# Patient Record
Sex: Female | Born: 1990
Health system: Southern US, Community
[De-identification: ages and names within clinical notes are randomized; demographics above are authoritative.]

## PROBLEM LIST (undated history)

## (undated) DIAGNOSIS — F909 Attention-deficit hyperactivity disorder, unspecified type: Secondary | ICD-10-CM

## (undated) DIAGNOSIS — F39 Unspecified mood [affective] disorder: Secondary | ICD-10-CM

## (undated) DIAGNOSIS — R569 Unspecified convulsions: Secondary | ICD-10-CM

## (undated) HISTORY — DX: Unspecified convulsions: R56.9

## (undated) HISTORY — DX: Unspecified mood (affective) disorder: F39

## (undated) HISTORY — DX: Attention-deficit hyperactivity disorder, unspecified type: F90.9

## (undated) HISTORY — PX: TONSILLECTOMY AND ADENOIDECTOMY: SUR1326

---
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
## 2011-05-02 IMAGING — CT CT PARANASAL SINUSES LIMITED
1 series · 10 of 12 positions shown, 13 images · non-contrast
Comparison: None

CLINICAL DATA: Chronic congestion.

CT PARANASAL SINUS LIMITED WITHOUT CONTRAST
TECHNIQUE: Multidetector CT images of the paranasal sinuses were
obtained in a single plane without contrast.

[Series 3: cor soft · axial · 0.35mm/px · z∈[+37,+127]mm · 10 of 12 slices shown, 13 images]
[im 2/12  brain]
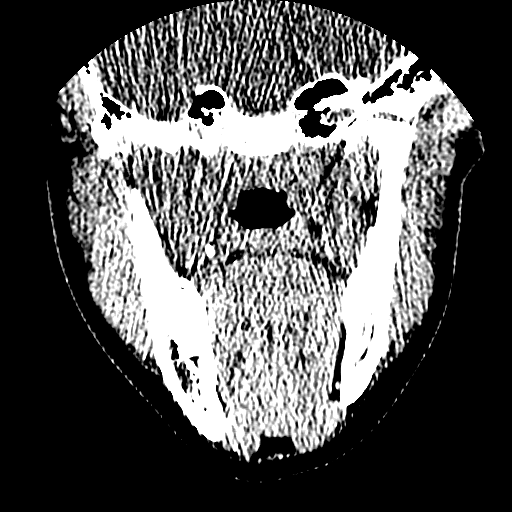
[im 2/12  bone]
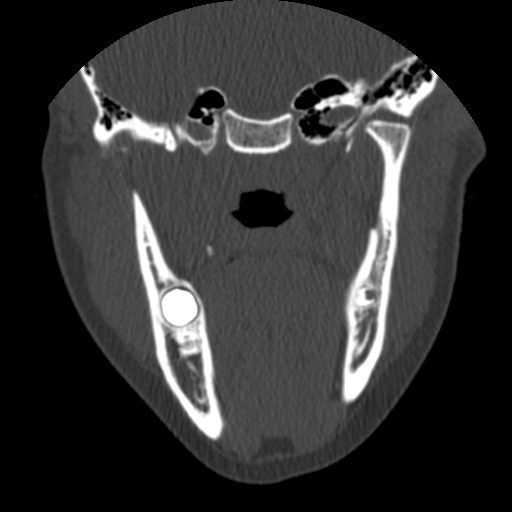
[im 3/12  bone]
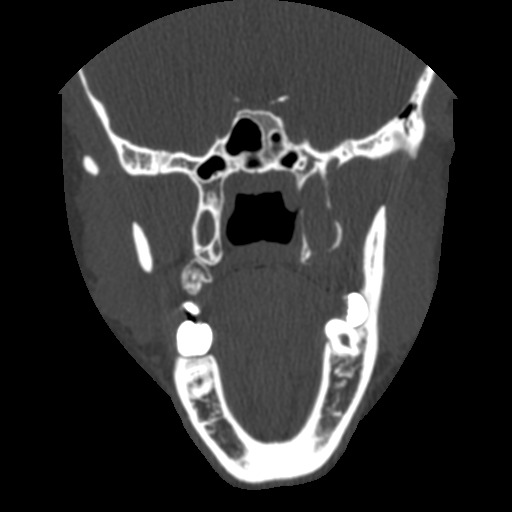
[im 4/12  bone]
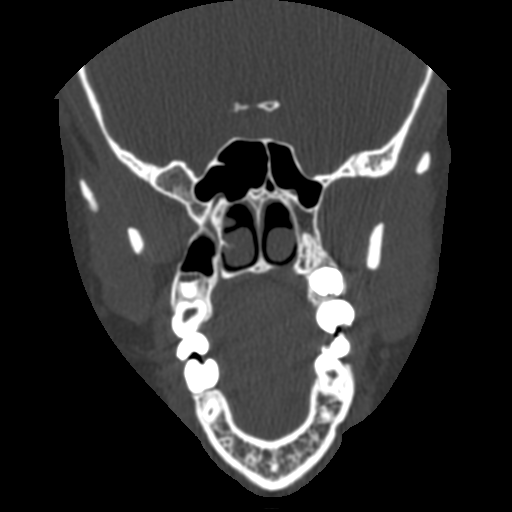
[im 5/12  bone]
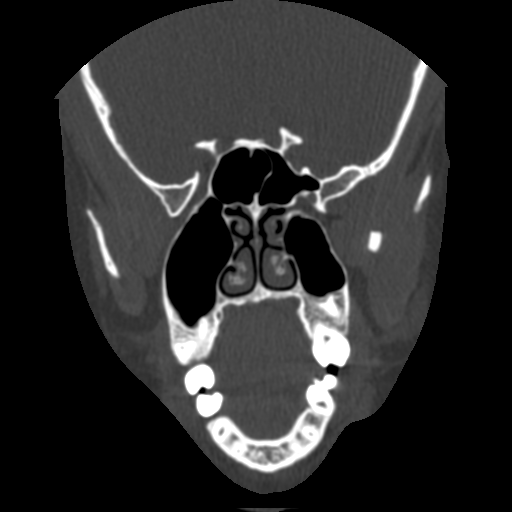
[im 6/12  brain]
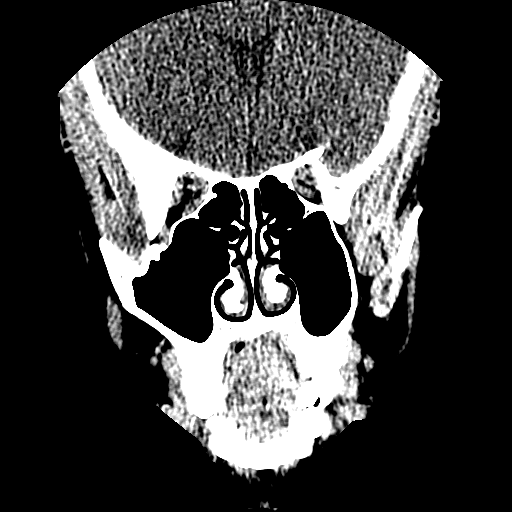
[im 6/12  bone]
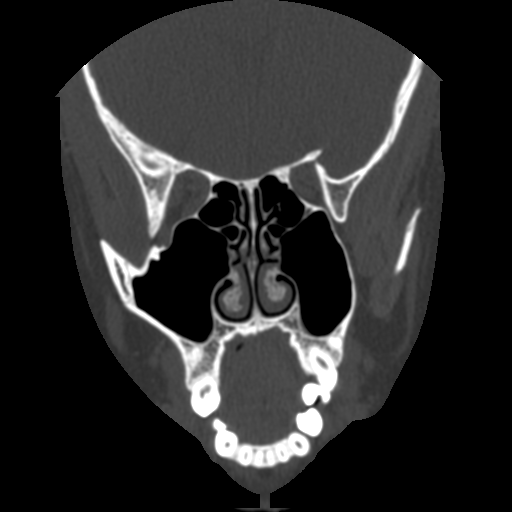
[im 7/12  bone]
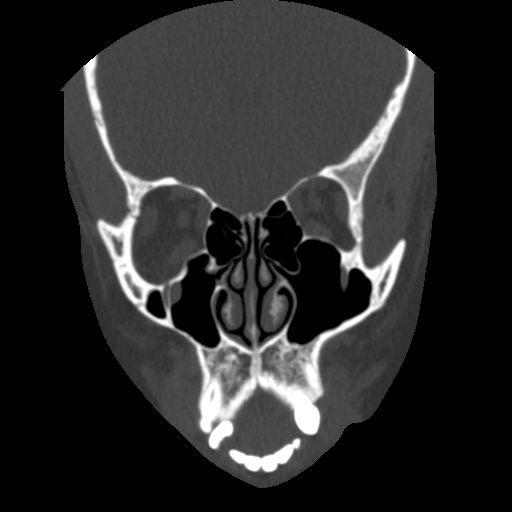
[im 8/12  bone]
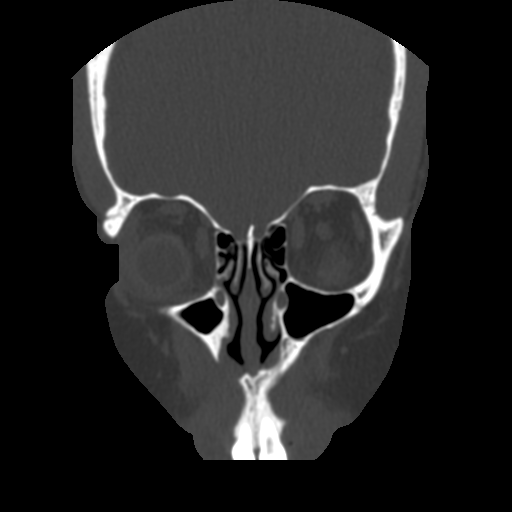
[im 9/12  bone]
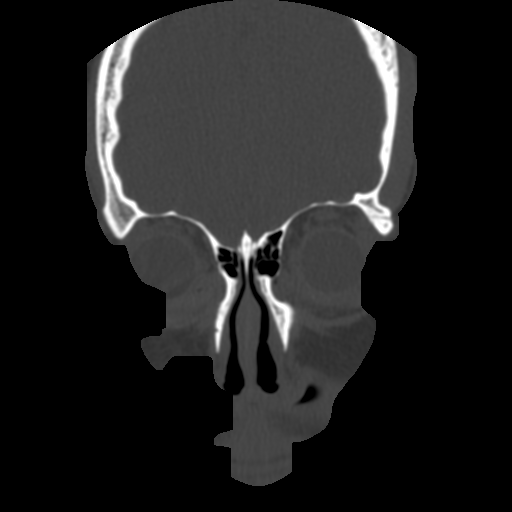
[im 10/12  brain]
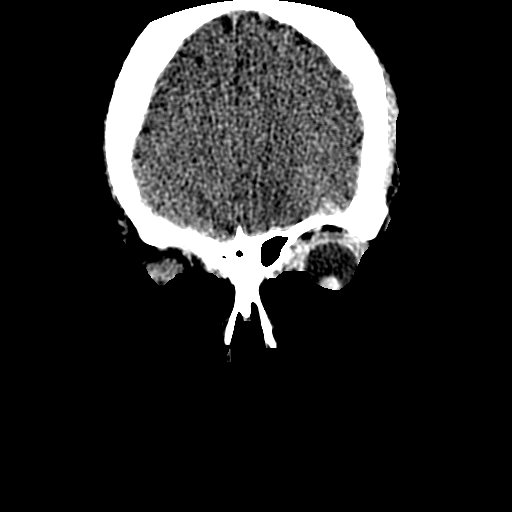
[im 10/12  bone]
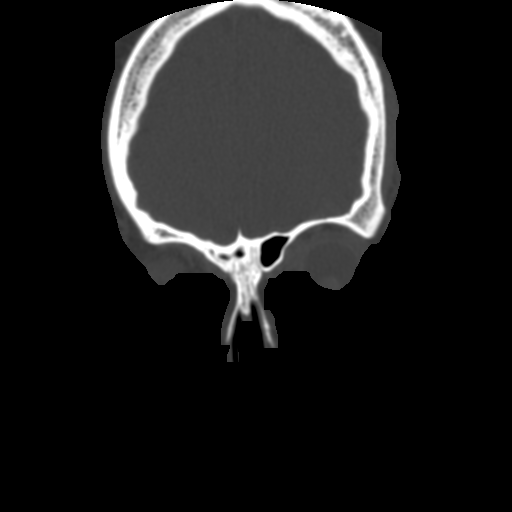
[im 11/12  bone]
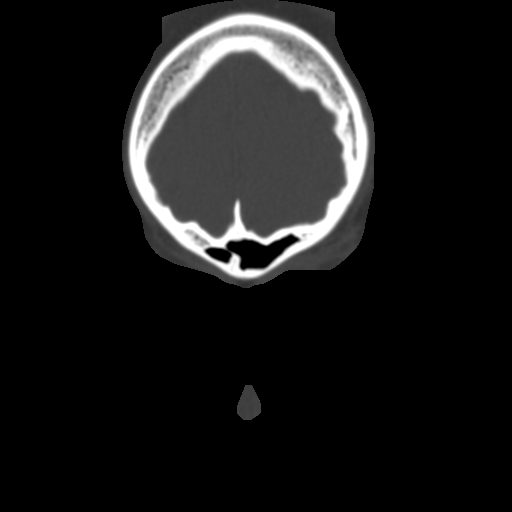

[10 of 12 positions shown; findings below may reference images not displayed]

FINDINGS: Minimal mucoperiosteal thickening noted in focal areas
within the maxillary sinuses bilaterally.  Remainder of the
paranasal sinuses are clear.  No air fluid levels.  No acute bony
abnormality.  Visualized orbital soft tissues unremarkable.
IMPRESSION: Minimal chronic maxillary sinusitis.

## 2011-12-21 ENCOUNTER — Encounter: Payer: Self-pay | Admitting: Family Medicine

## 2011-12-21 ENCOUNTER — Ambulatory Visit (INDEPENDENT_AMBULATORY_CARE_PROVIDER_SITE_OTHER): Payer: Medicaid Other | Admitting: Family Medicine

## 2011-12-21 VITALS — BP 133/89 | HR 78 | Temp 98.8°F | Wt 201.1 lb

## 2011-12-21 DIAGNOSIS — H6090 Unspecified otitis externa, unspecified ear: Secondary | ICD-10-CM | POA: Insufficient documentation

## 2011-12-21 DIAGNOSIS — E669 Obesity, unspecified: Secondary | ICD-10-CM

## 2011-12-21 DIAGNOSIS — K5900 Constipation, unspecified: Secondary | ICD-10-CM | POA: Insufficient documentation

## 2011-12-21 DIAGNOSIS — H60399 Other infective otitis externa, unspecified ear: Secondary | ICD-10-CM

## 2011-12-21 DIAGNOSIS — F39 Unspecified mood [affective] disorder: Secondary | ICD-10-CM

## 2011-12-21 MED ORDER — HYDROCORTISONE-ACETIC ACID 1-2 % OT SOLN: 3.0000 [drp] | Freq: Two times a day (BID) | OTIC | Status: AC

## 2011-12-21 NOTE — Patient Instructions (Signed)
Use ear drops  Stop using Qtips  Can use 1 drop of mineral oil to help keep ears clean  Follow-up yearly

## 2011-12-21 NOTE — Progress Notes (Signed)
  Subjective:    Patient ID: Ashley [name], female    DOB: 1990/08/29, 21 y.o.   MRN: [id number]  HPI Here to establish care  Left ear itching: few days ago.  No pain, no fever.  Using Q-tips to scratch ears  Constipation:  infrequent bowel movements.  Wants to know what to do.  Takes occasional miralax with good resolution.    Review of Systems Patient Information Form: Screening and ROS  AUDIT-C Score: 0 Do you feel safe in relationships? no PHQ-2:negative  Review of Symptoms  General:  Negative for nexplained weight loss, fever Skin: Negative for new or changing mole, sore that won't heal HEENT: Negative for trouble hearing, trouble seeing, ringing in ears, mouth sores, hoarseness, change in voice, dysphagia. CV:  Negative for chest pain, dyspnea, edema, palpitations Resp: Negative for cough, dyspnea, hemoptysis GI: Negative for nausea, vomiting, diarrhea, constipation, abdominal pain, melena, hematochezia. GU: Negative for dysuria, incontinence, urinary hesitance, hematuria, vaginal or penile discharge, polyuria, sexual difficulty, lumps in testicle or breasts MSK: Negative for muscle cramps or aches, joint pain or swelling Neuro: Negative for headaches, weakness, numbness, dizziness, passing out/fainting Psych: Negative for depression, anxiety, memory problems       Objective:   Physical Exam GEN: Alert & Oriented, No acute distress HEENT: [place]/AT. EOMI, PERRLA, no conjunctival injection or scleral icterus.  Bilateral tympanic membranes intact without erythema or effusion.  Left tM with some erythema and exoriation  .  Nares without edema or rhinorrhea.  Oropharynx is without erythema or exudates.  No anterior or posterior cervical lymphadenopathy. CV:  Regular Rate & Rhythm, no murmur Respiratory:  Normal work of breathing, CTAB Abd:  + BS, soft, no tenderness to palpation Ext: no pre-tibial edema Psych:  Alert, oriented.  Illogical thought.  Rapid speech.   Tangential.       Assessment & Plan:

## 2011-12-21 NOTE — Assessment & Plan Note (Signed)
Discussed importance of improving exercise and nutrition to prevent chronic diseases.  Mom and daughter plan on working on exercising together.  They state she has had a negative "diabetes test" in the past year.   Will follow-up screening in 1 year.

## 2011-12-21 NOTE — Assessment & Plan Note (Signed)
Mild irriitation,  rxed vosol hc for itching, advised on proper ear hygiene.

## 2011-12-21 NOTE — Assessment & Plan Note (Signed)
Functional.  Advised to start taking a daily fiber supplement.

## 2012-03-08 ENCOUNTER — Encounter: Payer: Self-pay | Admitting: Family Medicine

## 2012-05-13 ENCOUNTER — Emergency Department (INDEPENDENT_AMBULATORY_CARE_PROVIDER_SITE_OTHER)
Admission: EM | Admit: 2012-05-13 | Discharge: 2012-05-13 | Disposition: A | Discharge status: Home / Self Care | Payer: Medicaid Other | Source: Home / Self Care | Attending: Emergency Medicine | Admitting: Emergency Medicine

## 2012-05-13 ENCOUNTER — Encounter (HOSPITAL_COMMUNITY): Payer: Self-pay | Admitting: Emergency Medicine

## 2012-05-13 DIAGNOSIS — J309 Allergic rhinitis, unspecified: Secondary | ICD-10-CM

## 2012-05-13 DIAGNOSIS — H6090 Unspecified otitis externa, unspecified ear: Secondary | ICD-10-CM

## 2012-05-13 DIAGNOSIS — J329 Chronic sinusitis, unspecified: Secondary | ICD-10-CM

## 2012-05-13 MED ORDER — AMOXICILLIN-POT CLAVULANATE 875-125 MG PO TABS: 1.0000 | ORAL_TABLET | Freq: Two times a day (BID) | ORAL | Status: DC

## 2012-05-13 MED ORDER — METHYLPREDNISOLONE ACETATE 80 MG/ML IJ SUSP
80.0000 mg | Freq: Once | INTRAMUSCULAR | Status: AC
  Administered 2012-05-13: 80 mg via INTRAMUSCULAR

## 2012-05-13 MED ORDER — PREDNISONE 10 MG PO TABS: ORAL_TABLET | ORAL | Status: DC

## 2012-05-13 MED ORDER — METHYLPREDNISOLONE ACETATE 80 MG/ML IJ SUSP
INTRAMUSCULAR | Status: AC
  Filled 2012-05-13: qty 1

## 2012-05-13 MED ORDER — HYDROCORTISONE-ACETIC ACID 1-2 % OT SOLN: 3.0000 [drp] | Freq: Three times a day (TID) | OTIC | Status: DC

## 2012-05-13 NOTE — ED Notes (Signed)
Pt c/o headaches, sneezing, head stuffiness. Itching/irritation of left ear. Pt does have thick sinus drainage. Symptoms x several days.

## 2012-05-13 NOTE — ED Provider Notes (Signed)
Chief Complaint  Patient presents with  . Facial Pain    History of Present Illness:   Ashley [name] is a 21 year old female who comes in today accompanied by her guardian. She and the guardian relate a 5-6 month history of chronic nasal symptoms. She describes sneezing, nasal congestion with clear to yellow drainage, itching of the nose, difficulty breathing his nose, itchy, watery, red eyes, and daily headaches which last all day long for which he takes ibuprofen without much relief. She also describes itching of her left ear but no pain or drainage. She tried some drops and this became better transiently. She has constant sensation of postnasal drip and frequent throat clearing. She seen Dr. Reather Converse for allergies and is on Astapro, steroid nasal spray, and Zyrtec. Dr. Willa Rough is also considering allergy shots. She denies fever, chills, sore throat, cough, or wheezing.  Review of Systems:  Other than noted above, the patient denies any of the following symptoms. Systemic:  No fever, chills, sweats, fatigue, myalgias, headache, or anorexia. Eye:  No redness, pain or drainage. ENT:  No earache, ear congestion, nasal congestion, sneezing, rhinorrhea, sinus pressure, sinus pain, post nasal drip, or sore throat. Lungs:  No cough, sputum production, wheezing, shortness of breath, or chest pain. GI:  No abdominal pain, nausea, vomiting, or diarrhea.  PMFSH:  Past medical history, family history, social history, meds, and allergies were reviewed.  Physical Exam:   Vital signs:  BP 104/90  Pulse 78  Temp 99.4 F (37.4 C) (Oral)  Resp 18  SpO2 100% General:  Alert, in no distress. Eye:  No conjunctival injection or drainage. Lids were normal. ENT:  TMs and canals were normal, without erythema or inflammation.  Nasal mucosa was clear and uncongested, without drainage.  Mucous membranes were moist.  Pharynx was clear, without exudate or drainage.  There were no oral ulcerations or lesions. Neck:  Supple,  no adenopathy, tenderness or mass. Lungs:  No respiratory distress.  Lungs were clear to auscultation, without wheezes, rales or rhonchi.  Breath sounds were clear and equal bilaterally.  Heart:  Regular rhythm, without gallops, murmers or rubs. Skin:  Clear, warm, and dry, without rash or lesions.  Course in Urgent Care Center:   She was given Depo-Medrol 80 mg IM and tolerated this well without any immediate side effects.  Assessment:  The primary encounter diagnosis was Allergic rhinitis. Diagnoses of Chronic sinusitis and Otitis externa were also pertinent to this visit.  Plan:   1.  The following meds were prescribed:   New Prescriptions   ACETIC ACID-HYDROCORTISONE (VOSOL-HC) OTIC SOLUTION    Place 3 drops into the left ear 3 (three) times daily.   AMOXICILLIN-CLAVULANATE (AUGMENTIN) 875-125 MG PER TABLET    Take 1 tablet by mouth 2 (two) times daily.   PREDNISONE (DELTASONE) 10 MG TABLET    Take 4 tabs daily for 4 days, 3 tabs daily for 4 days, 2 tabs daily for 4 days, then 1 tab daily for 4 days.   2.  The patient was instructed in symptomatic care and handouts were given. She was given information in allergen avoidance and told to followup with Dr. Willa Rough. 3.  The patient was told to return if becoming worse in any way, if no better in 3 or 4 days, and given some red flag symptoms that would indicate earlier return.   Reuben Likes, MD 05/13/12 1016

## 2012-10-25 ENCOUNTER — Telehealth: Payer: Self-pay | Admitting: *Deleted

## 2012-10-25 NOTE — Telephone Encounter (Signed)
Mother is calling - states Ashley [name] was inpatient this past weekend and was without her birth control pills. Ashley [name] has started to have a withdrawal bleed and mother wants to know what to do to stop it. Ashley [name] has resumed her Seasonique. Per Dr Clearance Coots: mother may give Ashley [name] an additional OCP daily for 7d then back down to her usual dose. Mother voiced understanding.

## 2013-01-23 ENCOUNTER — Ambulatory Visit (INDEPENDENT_AMBULATORY_CARE_PROVIDER_SITE_OTHER): Payer: Medicare Other | Admitting: Obstetrics & Gynecology

## 2013-01-23 ENCOUNTER — Encounter: Payer: Self-pay | Admitting: Obstetrics & Gynecology

## 2013-01-23 VITALS — BP 105/69 | HR 79 | Temp 98.5°F | Wt 202.4 lb

## 2013-01-23 DIAGNOSIS — N898 Other specified noninflammatory disorders of vagina: Secondary | ICD-10-CM

## 2013-01-23 DIAGNOSIS — N946 Dysmenorrhea, unspecified: Secondary | ICD-10-CM

## 2013-01-23 DIAGNOSIS — Z708 Other sex counseling: Secondary | ICD-10-CM

## 2013-01-23 DIAGNOSIS — IMO0001 Reserved for inherently not codable concepts without codable children: Secondary | ICD-10-CM

## 2013-01-23 LAB — POCT URINE PREGNANCY: Preg Test, Ur: NEGATIVE

## 2013-01-23 NOTE — Progress Notes (Signed)
.   Subjective:     Ashley [name] is a 22 y.o. female here for a follow exam and refill on her birth control.  Current complaints - abnormal discharge.  Personal health questionnaire reviewed: no.   Gynecologic History No LMP recorded. Patient is not currently having periods (Reason: Other). Contraception: OCP (estrogen/progesterone) Last Pap: N/A Last mammogram: N/A  Obstetric History OB History   Grav Para Term Preterm Abortions TAB SAB Ect Mult Living                   The following portions of the patient's history were reviewed and updated as appropriate: allergies, current medications, past family history, past medical history, past social history, past surgical history and problem list.  Review of Systems Pertinent items are noted in HPI.    Objective:    General appearance: alert Breasts: normal appearance, no masses or tenderness Abdomen: soft, non-tender; bowel sounds normal; no masses,  no organomegaly Pelvic: cervix normal in appearance, external genitalia normal, no adnexal masses or tenderness, uterus normal size, shape, and consistency and vagina normal without discharge    Assessment:    Healthy female exam.    Plan:    Return in 6 mths

## 2013-01-24 ENCOUNTER — Encounter: Payer: Self-pay | Admitting: Obstetrics & Gynecology

## 2013-01-25 DIAGNOSIS — N946 Dysmenorrhea, unspecified: Secondary | ICD-10-CM | POA: Insufficient documentation

## 2013-01-25 LAB — GC/CHLAMYDIA PROBE AMP: GC Probe RNA: NEGATIVE

## 2013-01-25 NOTE — Patient Instructions (Signed)
Health Maintenance, 18- to 21-Year-Old SCHOOL PERFORMANCE After high school completion, the young adult may be attending college, technical or vocational school, or entering the military or the work force. SOCIAL AND EMOTIONAL DEVELOPMENT The young adult establishes adult relationships and explores sexual identity. Young adults may be living at home or in a college dorm or apartment. Increasing independence is important with young adults. Throughout adolescence, teens should assume responsibility of their own health care. IMMUNIZATIONS Most young adults should be fully vaccinated. A booster dose of Tdap (tetanus, diphtheria, and pertussis, or "whooping cough"), a dose of meningococcal vaccine to protect against a certain type of bacterial meningitis, hepatitis A, human papillomarvirus (HPV), chickenpox, or measles vaccines may be indicated, if not given at an earlier age. Annual influenza or "flu" vaccination should be considered during flu season.  TESTING Annual screening for vision and hearing problems is recommended. Vision should be screened objectively at least once between 18 and 21 years of age. The young adult may be screened for anemia or tuberculosis. Young adults should have a blood test to check for high cholesterol during this time period. Young adults should be screened for use of alcohol and drugs. If the young adult is sexually active, screening for sexually transmitted infections, pregnancy, or HIV may be performed. Screening for cervical cancer should be performed within 3 years of beginning sexual activity. NUTRITION AND ORAL HEALTH  Adequate calcium intake is important. Consume 3 servings of low-fat milk and dairy products daily. For those who do not drink milk or consume dairy products, calcium enriched foods, such as juice, bread, or cereal, dark, leafy greens, or canned fish are alternate sources of calcium.  Drink plenty of water. Limit fruit juice to 8 to 12 ounces per day.  Avoid sugary beverages or sodas.  Discourage skipping meals, especially breakfast. Teens should eat a good variety of vegetables and fruits, as well as lean meats.  Avoid high fat, high salt, and high sugar foods, such as candy, chips, and cookies.  Encourage young adults to participate in meal planning and preparation.  Eat meals together as a family whenever possible. Encourage conversation at mealtime.  Limit fast food choices and eating out at restaurants.  Brush teeth twice a day and floss.  Schedule dental exams twice a year. SLEEP Regular sleep habits are important. PHYSICAL, SOCIAL, AND EMOTIONAL DEVELOPMENT  One hour of regular physical activity daily is recommended. Continue to participate in sports.  Encourage young adults to develop their own interests and consider community service or volunteerism.  Provide guidance to the young adult in making decisions about college and work plans.  Make sure that young adults know that they should never be in a situation that makes them uncomfortable, and they should tell partners if they do not want to engage in sexual activity.  Talk to the young adult about body image. Eating disorders may be noted at this time. Young adults may also be concerned about being overweight. Monitor the young adult for weight gain or loss.  Mood disturbances, depression, anxiety, alcoholism, or attention problems may be noted in young adults. Talk to the caregiver if there are concerns about mental illness.  Negotiate limit setting and independent decision making.  Encourage the young adult to handle conflict without physical violence.  Avoid loud noises which may impair hearing.  Limit television and computer time to 2 hours per day. Individuals who engage in excessive sedentary activity are more likely to become overweight. RISK BEHAVIORS  Sexually active   young adults need to take precautions against pregnancy and sexually transmitted  infections. Talk to young adults about contraception.  Provide a tobacco-free and drug-free environment for the young adult. Talk to the young adult about drug, tobacco, and alcohol use among friends or at friends' homes. Make sure the young adult knows that smoking tobacco or marijuana and taking drugs have health consequences and may impact brain development.  Teach the young adult about appropriate use of over-the-counter or prescription medicines.  Establish guidelines for driving and for riding with friends.  Talk to young adults about the risks of drinking and driving or boating. Encourage the young adult to call you if he or she or friends have been drinking or using drugs.  Remind young adults to wear seat belts at all times in cars and life vests in boats.  Young adults should always wear a properly fitted helmet when they are riding a bicycle.  Use caution with all-terrain vehicles (ATVs) or other motorized vehicles.  Do not keep handguns in the home. (If you do, the gun and ammunition should be locked separately and out of the young adult's access.)  Equip your home with smoke detectors and change the batteries regularly. Make sure all family members know the fire escape plans for your home.  Teach young adults not to swim alone and not to dive in shallow water.  All individuals should wear sunscreen that protects against UVA and UVB light with at least a sun protection factor (SPF) of 30 when out in the sun. This minimizes sun burning. WHAT'S NEXT? Young adults should visit their pediatrician or family physician yearly. By young adulthood, health care should be transitioned to a family physician or internal medicine specialist. Sexually active females may want to begin annual physical exams with a gynecologist. Document Released: 10/20/2006 Document Revised: 10/17/2011 Document Reviewed: 11/09/2006 [employer] Patient Information 2014 [employer].  

## 2013-08-21 ENCOUNTER — Ambulatory Visit (INDEPENDENT_AMBULATORY_CARE_PROVIDER_SITE_OTHER): Payer: Medicare Other | Admitting: Obstetrics & Gynecology

## 2013-09-02 ENCOUNTER — Encounter: Payer: Self-pay | Admitting: Obstetrics & Gynecology

## 2013-09-02 ENCOUNTER — Ambulatory Visit (INDEPENDENT_AMBULATORY_CARE_PROVIDER_SITE_OTHER): Payer: Medicare Other | Admitting: Obstetrics & Gynecology

## 2013-09-02 VITALS — BP 119/79 | HR 84 | Temp 97.9°F | Wt 228.0 lb

## 2013-09-02 DIAGNOSIS — Z0000 Encounter for general adult medical examination without abnormal findings: Secondary | ICD-10-CM

## 2013-09-02 DIAGNOSIS — N760 Acute vaginitis: Secondary | ICD-10-CM

## 2013-09-02 DIAGNOSIS — Z113 Encounter for screening for infections with a predominantly sexual mode of transmission: Secondary | ICD-10-CM

## 2013-09-02 DIAGNOSIS — Z01419 Encounter for gynecological examination (general) (routine) without abnormal findings: Secondary | ICD-10-CM

## 2013-09-02 DIAGNOSIS — Z124 Encounter for screening for malignant neoplasm of cervix: Secondary | ICD-10-CM

## 2013-09-02 DIAGNOSIS — F070 Personality change due to known physiological condition: Secondary | ICD-10-CM

## 2013-09-02 MED ORDER — CLOTRIMAZOLE 1 % EX CREA: 1.0000 "application " | TOPICAL_CREAM | Freq: Two times a day (BID) | CUTANEOUS | Status: DC

## 2013-09-02 NOTE — Patient Instructions (Signed)
Human Papillomavirus Vaccine, Quadrivalent What is this medicine? HUMAN PAPILLOMAVIRUS VACCINE (HYOO muhn pap uh LOH muh vahy ruhs vak SEEN) is a vaccine. It is used to prevent infections of four types of the human papillomavirus. In women, the vaccine may lower your risk of getting cervical, vaginal, or anal cancer and genital warts. In men, the vaccine may lower your risk of getting genital warts and anal cancer. You cannot get these diseases from the vaccine. This vaccine does not treat these diseases. This medicine may be used for other purposes; ask your health care provider or pharmacist if you have questions. COMMON BRAND NAME(S): Gardasil What should I tell my health care provider before I take this medicine? They need to know if you have any of these conditions: -fever or infection -hemophilia -HIV infection or AIDS -immune system problems -low platelet count -an unusual reaction to Human Papillomavirus Vaccine, yeast, other medicines, foods, dyes, or preservatives -pregnant or trying to get pregnant -breast-feeding How should I use this medicine? This vaccine is for injection in a muscle on your upper arm or thigh. It is given by a health care professional. Dennis Bast will be observed for 15 minutes after each dose. Sometimes, fainting happens after the vaccine is given. You may be asked to sit or lie down during the 15 minutes. Three doses are given. The second dose is given 2 months after the first dose. The last dose is given 4 months after the second dose. A copy of a Vaccine Information Statement will be given before each vaccination. Read this sheet carefully each time. The sheet may change frequently. Talk to your pediatrician regarding the use of this medicine in children. While this drug may be prescribed for children as young as 23 years of age for selected conditions, precautions do apply. Overdosage: If you think you have taken too much of this medicine contact a poison control  center or emergency room at once. NOTE: This medicine is only for you. Do not share this medicine with others. What if I miss a dose? All 3 doses of the vaccine should be given within 6 months. Remember to keep appointments for follow-up doses. Your health care provider will tell you when to return for the next vaccine. Ask your health care professional for advice if you are unable to keep an appointment or miss a scheduled dose. What may interact with this medicine? -medicines that suppress your immune system like some medicines for cancer -steroid medicines like prednisone or cortisone -other vaccines This list may not describe all possible interactions. Give your health care provider a list of all the medicines, herbs, non-prescription drugs, or dietary supplements you use. Also tell them if you smoke, drink alcohol, or use illegal drugs. Some items may interact with your medicine. What should I watch for while using this medicine? This vaccine may not fully protect everyone. Continue to have regular pelvic exams and cervical or anal cancer screenings as directed by your doctor. The Human Papillomavirus is a sexually transmitted disease. It can be passed by any kind of sexual activity that involves genital contact. The vaccine works best when given before you have any contact with the virus. Many people who have the virus do not have any signs or symptoms. Tell your doctor or health care professional if you have any reaction or unusual symptom after getting the vaccine. What side effects may I notice from receiving this medicine? Side effects that you should report to your doctor or health care professional  as soon as possible: -allergic reactions like skin rash, itching or hives, swelling of the face, lips, or tongue -breathing problems -feeling faint or lightheaded, falls Side effects that usually do not require medical attention (report to your doctor or health care professional if they  continue or are bothersome): -cough -fever -redness, warmth, swelling, pain, or itching at site where injected This list may not describe all possible side effects. Call your doctor for medical advice about side effects. You may report side effects to FDA at 1-800-FDA-1088. Where should I keep my medicine? This drug is given in a hospital or clinic and will not be stored at home. NOTE: This sheet is a summary. It may not cover all possible information. If you have questions about this medicine, talk to your doctor, pharmacist, or health care provider.  2014, [employer]vier/Gold Standard. (2009-07-30 11:52:23)

## 2013-09-02 NOTE — Progress Notes (Signed)
Subjective:     Ashley [name] is a 23 y.o. female here for a routine exam.  Current complaints:  Pt states that she does have occasional itching.  Personal health questionnaire reviewed: yes.   Gynecologic History Patient's last menstrual period was 08/05/2013. Contraception: OCP (estrogen/progesterone) Last Pap: none   Obstetric History OB History  No data available     The following portions of the patient's history were reviewed and updated as appropriate: allergies, current medications, past family history, past medical history, past social history, past surgical history and problem list.  Review of Systems Pertinent items are noted in HPI.    Objective:    BP 119/79  Pulse 84  Temp(Src) 97.9 F (36.6 C)  Wt 228 lb (103.42 kg)  LMP 08/05/2013  General Appearance:    Alert, cooperative, no distress, appears stated age  Breast Exam:    No tenderness, masses, or nipple abnormality  Abdomen:     Soft, non-tender, bowel sounds active all four quadrants,    no masses, no organomegaly  Genitalia:    Focal skin with white appearance SPEC: scant white discharge Bimanual: uterus NT, no adnexal masses, NT   Assessment:   Cutaneous candida  Plan:  HIV RPR Topical antifungal Return prn

## 2013-09-03 LAB — PAP IG AND CT-NG NAA
Chlamydia Probe Amp: NEGATIVE
GC Probe Amp: NEGATIVE

## 2013-09-03 LAB — RPR

## 2013-09-03 LAB — HIV ANTIBODY (ROUTINE TESTING W REFLEX): HIV: NONREACTIVE

## 2013-12-31 ENCOUNTER — Telehealth: Payer: Self-pay | Admitting: *Deleted

## 2013-12-31 NOTE — Telephone Encounter (Signed)
Mother called and requested samples of birth control to have on hand if Ashley [name] needs them to suppliment her OCP. Ashley [name] dropped her pill and the samples she has on hand have expired. Told mother she could come to the office and pick up some more.

## 2014-01-22 ENCOUNTER — Ambulatory Visit: Payer: Medicare Other | Admitting: Obstetrics & Gynecology

## 2014-02-03 ENCOUNTER — Encounter: Payer: Self-pay | Admitting: Obstetrics & Gynecology

## 2014-02-03 ENCOUNTER — Ambulatory Visit (INDEPENDENT_AMBULATORY_CARE_PROVIDER_SITE_OTHER): Payer: Medicaid Other | Admitting: Obstetrics & Gynecology

## 2014-02-03 VITALS — BP 125/85 | HR 75 | Temp 98.9°F | Wt 228.0 lb

## 2014-02-03 DIAGNOSIS — N946 Dysmenorrhea, unspecified: Secondary | ICD-10-CM

## 2014-02-03 NOTE — Patient Instructions (Signed)
Human Papillomavirus (HPV) Gardasil® Vaccine  What You Need to Know  WHAT IS HPV?  · Genital human papillomavirus (HPV) is the most common sexually transmitted virus in the [place]. More than half of sexually active men and women are infected with HPV at some time in their lives.  · About 20 million Americans are currently infected, and about 6 million more get infected each year. HPV is usually spread through sexual contact.  · Most HPV infections do not cause any symptoms and go away on their own. But HPV can cause cervical cancer in women. Cervical cancer is the 2nd leading cause of cancer deaths among women around the world. In the [place], about 12,000 women get cervical cancer every year and about 4,000 are expected to die from it.  · HPV is also associated with several less common cancers, such as vaginal and vulvar cancers in women, and anal and oropharyngeal (back of the throat, including base of tongue and tonsils) cancers in both men and women. HPV can also cause genital warts and warts in the throat.  · There is no cure for HPV infection, but some of the problems it causes can be treated.  HPV VACCINE: WHY GET VACCINATED?  · The HPV vaccine you are getting is 1 of 2 vaccines that can be given to prevent HPV. It may be given to both males and females.  · This vaccine can prevent most cases of cervical cancer in females, if it is given before exposure to the virus. In addition, it can prevent vaginal and vulvar cancer in females, and genital warts and anal cancer in both males and females.  · Protection from HPV vaccine is expected to be long-lasting. But vaccination is not a substitute for cervical cancer screening. Women should still get regular Pap tests.  WHO SHOULD GET THIS HPV VACCINE AND WHEN?  HPV vaccine is given as a 3-dose series.  · 1st Dose: Now.  · 2nd Dose: 1 to 2 months after Dose 1.  · 3rd Dose: 6 months after Dose 1.  Additional (booster) doses are not recommended.  Routine  Vaccination  This HPV vaccine is recommended for girls and boys 11 or 22 years of age. It may be given starting at age 9.  Why is HPV vaccine recommended at 11 or 22 years of age?   HPV infection is easily acquired, even with only one sex partner. That is why it is important to get HPV vaccine before any sexual contact takes place. Also, response to the vaccine is better at this age than at older ages.  Catch-Up Vaccination  This vaccine is recommended for the following people who have not completed the 3-dose series:   · Females 13 through 22 years of age.  · Males 13 through 21 years of age.  This vaccine may be given to men 22 through 22 years of age who have not completed the 3-dose series.  It is recommended for men through age 26 who have sex with men or whose immune system is weakened because of HIV infection, other illness, or medications.   HPV vaccine may be given at the same time as other vaccines.  SOME PEOPLE SHOULD NOT GET HPV VACCINE OR SHOULD WAIT  · Anyone who has ever had a life-threatening allergic reaction to any other component of HPV vaccine, or to a previous dose of HPV vaccine, should not get the vaccine. Tell your doctor if the person getting vaccinated has any severe   allergies, including an allergy to yeast.  · HPV vaccine is not recommended for pregnant women. However, receiving HPV vaccine when pregnant is not a reason to consider terminating the pregnancy. Women who are breastfeeding may get the vaccine.  · People who are mildly ill when a dose of HPV is planned can still be vaccinated. People with a moderate or severe illness should wait until they are better.  WHAT ARE THE RISKS FROM THIS VACCINE?  · This HPV vaccine has been used in the [place] and around the world for about 6 years and has been very safe.  · However, any medicine could possibly cause a serious problem, such as a severe allergic reaction. The risk of any vaccine causing a serious injury, or death, is extremely  small.  · Life-threatening allergic reactions from vaccines are very rare. If they do occur, it would be within a few minutes to a few hours after the vaccination.  Several mild to moderate problems are known to occur with HPV vaccine. These do not last long and go away on their own.  · Reactions in the arm where the shot was given:  ¨ Pain (about 8 people in 10).  ¨ Redness or swelling (about 1 person in 4).  · Fever:  ¨ Mild (100° F or 37.8° C) (about 1 person in 10).  ¨ Moderate (102° F or 38.9° C) (about 1 person in 65).  · Other problems:  ¨ Headache (about 1 person in 3).  ¨ Fainting: Brief fainting spells and related symptoms (such as jerking movements) can happen after any medical procedure, including vaccination. Sitting or lying down for about 15 minutes after a vaccination can help prevent fainting and injuries caused by falls. Tell your doctor if the patient feels dizzy or lightheaded, or has vision changes or ringing in the ears.  · Like all vaccines, HPV vaccines will continue to be monitored for unusual or severe problems.  WHAT IF THERE IS A SERIOUS REACTION?  What should I look for?  · Any unusual condition, such as a high fever or unusual behavior. Signs of a serious allergic reaction can include difficulty breathing, hoarseness or wheezing, hives, paleness, weakness, a fast heartbeat, or dizziness.  What should I do?  · Call a doctor, or get the person to a doctor right away.  · Tell your doctor what happened, the date and time it happened, and when the vaccination was given.  · Ask your doctor, nurse, or health department to report the reaction by filing a Vaccine Adverse Event Reporting System (VAERS) form. Or, you can file this report through the VAERS website at www.vaers.hhs.gov or by calling 1-800-822-7967.  VAERS does not provide medical advice.  THE NATIONAL VACCINE INJURY COMPENSATION PROGRAM  · The National Vaccine Injury Compensation Program (VICP) is a federal program that was created  to compensate people who may have been injured by certain vaccines.  · Persons who believe they may have been injured by a vaccine can learn about the program and about filing a claim by calling 1-800-338-2382 or visiting the VICP website at www.hrsa.gov/vaccinecompensation  HOW CAN I LEARN MORE?  · Ask your doctor.  · Call your local or state health department.  · Contact the Centers for Disease Control and Prevention (CDC):  ¨ Call 1-800-232-4636 (1-800-CDC-INFO)  or  ¨ Visit CDC's website at www.cdc.gov/vaccines  CDC Human Papillomavirus (HPV) Gardasil® (Interim) 12/23/11  Document Released: 05/22/2006 Document Revised: 05/15/2013 Document Reviewed: 11/13/2012  ExitCare® Patient Information ©  2015 [employer] LLC. This information is not intended to replace advice given to you by your health care provider. Make sure you discuss any questions you have with your health care provider.

## 2014-02-03 NOTE — Progress Notes (Signed)
Patient ID: Ashley [name], female   DOB: 02/27/1991, 23 y.o.   MRN: [id number]  Chief Complaint  Patient presents with  . Follow-up    HPI Ashley [name] is a 23 y.o. female.  H/O dysmenorrhea with good response to COCP.  HPI  Past Medical History  Diagnosis Date  . ADHD (attention deficit hyperactivity disorder)   . Mood disorder   . Seizures     childhood    Past Surgical History  Procedure Laterality Date  . Tonsillectomy and adenoidectomy      Family History  Problem Relation Age of Onset  . Diabetes Mother   . Asthma Sister   . Stroke Maternal Uncle     Social History History  Substance Use Topics  . Smoking status: Never Smoker   . Smokeless tobacco: Not on file  . Alcohol Use: No    No Known Allergies  Current Outpatient Prescriptions  Medication Sig Dispense Refill  . traZODone (DESYREL) 50 MG tablet Take 25-50 mg by mouth at bedtime. Prescribed by [facility]      . acetic acid-hydrocortisone (VOSOL-HC) otic solution Place 3 drops into the left ear 3 (three) times daily.  10 mL  12  . amoxicillin-clavulanate (AUGMENTIN) 875-125 MG per tablet Take 1 tablet by mouth 2 (two) times daily.  20 tablet  0  . azelastine (ASTEPRO) 137 MCG/SPRAY nasal spray Place 1 spray into the nose 2 (two) times daily. Use in each nostril as directed.  Prescribed by Dr. Willa RoughHicks- allergy      . Beclomethasone Dipropionate (QNASL) 80 MCG/ACT AERS Place into the nose. Prescribed by Dr. Willa RoughHicks- allergy      . clotrimazole (LOTRIMIN) 1 % cream Apply 1 application topically 2 (two) times daily. To affected area  30 g  0  . flintstones complete (FLINTSTONES) 60 MG chewable tablet Chew 1 tablet by mouth daily.      Marland Kitchen. guanFACINE (INTUNIV) 1 MG TB24 Take 1 mg by mouth daily. Prescribed by [facility]      . GuanFACINE HCl 4 MG TB24 Take by mouth daily. Prescribed by [facility]      . INVEGA SUSTENNA 156 MG/ML SUSP       . Levonorgestrel-Ethinyl Estradiol (SEASONIQUE) 0.15-0.03 &0.01  MG tablet Take 1 tablet by mouth daily. Prescribed by Dr. Tamela OddiJackson-Moore      . NASONEX 50 MCG/ACT nasal spray       . Olopatadine HCl (PATADAY) 0.2 % SOLN Apply to eye. Prescribed by Dr. Willa RoughHicks- allergy      . omeprazole (PRILOSEC) 20 MG capsule       . predniSONE (DELTASONE) 10 MG tablet Take 4 tabs daily for 4 days, 3 tabs daily for 4 days, 2 tabs daily for 4 days, then 1 tab daily for 4 days.  40 tablet  0  . RISPERDAL CONSTA 25 MG injection       . topiramate (TOPAMAX) 50 MG tablet Take 50 mg by mouth daily. Prescribed by [facility]      . triamcinolone ointment (KENALOG) 0.1 %        No current facility-administered medications for this visit.    Review of Systems Review of Systems Constitutional: negative for fatigue and weight loss Respiratory: negative for cough and wheezing Cardiovascular: negative for chest pain, fatigue and palpitations Gastrointestinal: negative for abdominal pain and change in bowel habits Genitourinary:negative for abnormal vaginal discharge Integument/breast: negative for nipple discharge Musculoskeletal:negative for myalgias Neurological: negative for gait problems and tremors  Behavioral/Psych: negative for abusive relationship, depression Endocrine: negative for temperature intolerance     Blood pressure 125/85, pulse 75, temperature 98.9 F (37.2 C), weight 103.42 kg (228 lb).  Physical Exam Physical Exam   50% of 15 min visit spent on counseling and coordination of care.   Data Reviewed None  Assessment    Dysmenorrhea controlled on extended cycle COCP    Plan    Catch-up HPV vaccine Meds ordered this encounter  Medications  . triamcinolone ointment (KENALOG) 0.1 %    Sig:   . INVEGA SUSTENNA 156 MG/ML SUSP    Sig:     Follow up as needed.         JACKSON-MOORE,LISA A 02/03/2014, 5:47 PM

## 2014-03-19 ENCOUNTER — Other Ambulatory Visit: Payer: Self-pay | Admitting: *Deleted

## 2014-03-19 MED ORDER — LEVONORGEST-ETH ESTRAD 91-DAY 0.15-0.03 &0.01 MG PO TABS
1.0000 | ORAL_TABLET | Freq: Every day | ORAL | Status: DC
Start: 2014-03-19 — End: 2016-07-05

## 2014-03-19 NOTE — Progress Notes (Signed)
Pt mother call to office requesting refill on her daughter birth control.  Pt mother made aware that a refill will be sent to her pharmacy.  Pt mother states that she was advised to get Guardasil injection for [place] as well.  Pt mother states that she will call to schedule appt at her convenience.

## 2014-05-20 ENCOUNTER — Emergency Department (INDEPENDENT_AMBULATORY_CARE_PROVIDER_SITE_OTHER)
Admission: EM | Admit: 2014-05-20 | Discharge: 2014-05-20 | Disposition: A | Discharge status: Home / Self Care | Payer: Medicare Other | Source: Home / Self Care | Attending: Family Medicine | Admitting: Family Medicine

## 2014-05-20 ENCOUNTER — Encounter (HOSPITAL_COMMUNITY): Payer: Self-pay | Admitting: Emergency Medicine

## 2014-05-20 DIAGNOSIS — L739 Follicular disorder, unspecified: Secondary | ICD-10-CM

## 2014-05-20 MED ORDER — SULFAMETHOXAZOLE-TRIMETHOPRIM 800-160 MG PO TABS: 2.0000 | ORAL_TABLET | Freq: Two times a day (BID) | ORAL | Status: DC

## 2014-05-20 MED ORDER — MUPIROCIN 2 % EX OINT: 1.0000 "application " | TOPICAL_OINTMENT | Freq: Two times a day (BID) | CUTANEOUS | Status: DC

## 2014-05-20 NOTE — Discharge Instructions (Signed)
Thank you for coming in today. Take Bactrim twice daily for one week Use the bacitracin nasal ointment for one week.  Use over-the-counter chlorhexidine wash at least once a week.  Followup with primary care provider.    Folliculitis  Folliculitis is redness, soreness, and swelling (inflammation) of the hair follicles. This condition can occur anywhere on the body. People with weakened immune systems, diabetes, or obesity have a greater risk of getting folliculitis. CAUSES  Bacterial infection. This is the most common cause.  Fungal infection.  Viral infection.  Contact with certain chemicals, especially oils and tars. Long-term folliculitis can result from bacteria that live in the nostrils. The bacteria may trigger multiple outbreaks of folliculitis over time. SYMPTOMS Folliculitis most commonly occurs on the scalp, thighs, legs, back, buttocks, and areas where hair is shaved frequently. An early sign of folliculitis is a small, white or yellow, pus-filled, itchy lesion (pustule). These lesions appear on a red, inflamed follicle. They are usually less than 0.2 inches (5 mm) wide. When there is an infection of the follicle that goes deeper, it becomes a boil or furuncle. A group of closely packed boils creates a larger lesion (carbuncle). Carbuncles tend to occur in hairy, sweaty areas of the body. DIAGNOSIS  Your caregiver can usually tell what is wrong by doing a physical exam. A sample may be taken from one of the lesions and tested in a lab. This can help determine what is causing your folliculitis. TREATMENT  Treatment may include:  Applying warm compresses to the affected areas.  Taking antibiotic medicines orally or applying them to the skin.  Draining the lesions if they contain a large amount of pus or fluid.  Laser hair removal for cases of long-lasting folliculitis. This helps to prevent regrowth of the hair. HOME CARE INSTRUCTIONS  Apply warm compresses to the affected  areas as directed by your caregiver.  If antibiotics are prescribed, take them as directed. Finish them even if you start to feel better.  You may take over-the-counter medicines to relieve itching.  Do not shave irritated skin.  Follow up with your caregiver as directed. SEEK IMMEDIATE MEDICAL CARE IF:   You have increasing redness, swelling, or pain in the affected area.  You have a fever. MAKE SURE YOU:  Understand these instructions.  Will watch your condition.  Will get help right away if you are not doing well or get worse. Document Released: 10/03/2001 Document Revised: 01/24/2012 Document Reviewed: 10/25/2011 [facility] Patient Information 2015 [employer], [place]. This information is not intended to replace advice given to you by your health care provider. Make sure you discuss any questions you have with your health care provider.

## 2014-05-20 NOTE — ED Notes (Addendum)
Recurrent skin infections, currently concerned for infection to left inner thigh

## 2014-05-20 NOTE — ED Provider Notes (Signed)
Ashley [name] is a 23 y.o. female who presents to Urgent Care today for folliculitis. He shows a small tender lesion on left thigh. This is been present for a day or so. This recurred several times over the past several months. No fevers or chills nausea vomiting or diarrhea. No medications used yet.   Past Medical History  Diagnosis Date  . ADHD (attention deficit hyperactivity disorder)   . Mood disorder   . Seizures     childhood   History  Substance Use Topics  . Smoking status: Never Smoker   . Smokeless tobacco: Not on file  . Alcohol Use: No   ROS as above Medications: No current facility-administered medications for this encounter.   Current Outpatient Prescriptions  Medication Sig Dispense Refill  . acetic acid-hydrocortisone (VOSOL-HC) otic solution Place 3 drops into the left ear 3 (three) times daily.  10 mL  12  . azelastine (ASTEPRO) 137 MCG/SPRAY nasal spray Place 1 spray into the nose 2 (two) times daily. Use in each nostril as directed.  Prescribed by Dr. Willa RoughHicks- allergy      . Beclomethasone Dipropionate (QNASL) 80 MCG/ACT AERS Place into the nose. Prescribed by Dr. Willa RoughHicks- allergy      . clotrimazole (LOTRIMIN) 1 % cream Apply 1 application topically 2 (two) times daily. To affected area  30 g  0  . flintstones complete (FLINTSTONES) 60 MG chewable tablet Chew 1 tablet by mouth daily.      Marland Kitchen. guanFACINE (INTUNIV) 1 MG TB24 Take 1 mg by mouth daily. Prescribed by [facility]      . GuanFACINE HCl 4 MG TB24 Take by mouth daily. Prescribed by [facility]      . INVEGA SUSTENNA 156 MG/ML SUSP       . Levonorgestrel-Ethinyl Estradiol (SEASONIQUE) 0.15-0.03 &0.01 MG tablet Take 1 tablet by mouth daily. Take 1 active tablet daily, skip non active pills.  1 Package  11  . mupirocin ointment (BACTROBAN) 2 % Place 1 application into the nose 2 (two) times daily.  30 g  1  . NASONEX 50 MCG/ACT nasal spray       . Olopatadine HCl (PATADAY) 0.2 % SOLN Apply to eye.  Prescribed by Dr. Willa RoughHicks- allergy      . omeprazole (PRILOSEC) 20 MG capsule       . predniSONE (DELTASONE) 10 MG tablet Take 4 tabs daily for 4 days, 3 tabs daily for 4 days, 2 tabs daily for 4 days, then 1 tab daily for 4 days.  40 tablet  0  . RISPERDAL CONSTA 25 MG injection       . sulfamethoxazole-trimethoprim (SEPTRA DS) 800-160 MG per tablet Take 2 tablets by mouth 2 (two) times daily.  28 tablet  0  . topiramate (TOPAMAX) 50 MG tablet Take 50 mg by mouth daily. Prescribed by [facility]      . traZODone (DESYREL) 50 MG tablet Take 25-50 mg by mouth at bedtime. Prescribed by [facility]      . triamcinolone ointment (KENALOG) 0.1 %         Exam:  BP 134/73  Pulse 95  Temp(Src) 98.6 F (37 C) (Oral)  Resp 12  SpO2 100% Gen: Well NAD HEENT: EOMI,  MMM Lungs: Normal work of breathing. CTABL Heart: RRR no MRG Abd: NABS, Soft. Nondistended, Nontender Exts: Brisk capillary refill, warm and well perfused.  Skin: Small tiny papule left inner thigh. Mildly tender. No fluctuance or induration.  No results found  for this or any previous visit (from the past 24 hour(s)). No results found.  Assessment and Plan: 23 y.o. female with folliculitis. Treatment with Bactrim. Mupirocin nasally as well as she has recurrent folliculitis and likely is colonized.   Discussed warning signs or symptoms. Please see discharge instructions. Patient expresses understanding.     Rodolph BongEvan S Saron Tweed, MD 05/20/14 [phone]

## 2014-06-17 ENCOUNTER — Other Ambulatory Visit: Payer: Self-pay | Admitting: *Deleted

## 2014-06-17 MED ORDER — FLUCONAZOLE 150 MG PO TABS: 150.0000 mg | ORAL_TABLET | Freq: Once | ORAL | Status: DC

## 2014-06-17 NOTE — Progress Notes (Signed)
Pt mother call to office requesting treatment for yeast infection for her daughter.  Pt mother made aware Diflucan would be sent to pharmacy per protocol.

## 2014-06-18 ENCOUNTER — Other Ambulatory Visit: Payer: Self-pay | Admitting: *Deleted

## 2014-06-18 ENCOUNTER — Telehealth: Payer: Self-pay | Admitting: *Deleted

## 2014-06-18 MED ORDER — FLUCONAZOLE 200 MG PO TABS: 200.0000 mg | ORAL_TABLET | ORAL | Status: DC

## 2014-06-18 NOTE — Telephone Encounter (Signed)
Patients mother called stating the Diflucan was sent wrong and she needs 2 more diflucan sent to the pharmacy [facility]. Rx sent to pharmacy. Patients mother notified that Rx had been sent and the Rx was to be taken every other day.

## 2014-08-04 ENCOUNTER — Encounter: Payer: Self-pay | Admitting: *Deleted

## 2014-08-05 ENCOUNTER — Encounter: Payer: Self-pay | Admitting: Obstetrics & Gynecology

## 2014-10-08 ENCOUNTER — Encounter: Payer: Self-pay | Admitting: Certified Nurse Midwife

## 2014-10-08 ENCOUNTER — Ambulatory Visit: Payer: Medicare Other | Admitting: Certified Nurse Midwife

## 2014-10-08 ENCOUNTER — Ambulatory Visit (INDEPENDENT_AMBULATORY_CARE_PROVIDER_SITE_OTHER): Payer: Medicare Other | Admitting: Certified Nurse Midwife

## 2014-10-08 VITALS — BP 129/81 | HR 100 | Temp 97.9°F | Wt 241.0 lb

## 2014-10-08 DIAGNOSIS — Z3041 Encounter for surveillance of contraceptive pills: Secondary | ICD-10-CM

## 2014-10-09 ENCOUNTER — Encounter: Payer: Self-pay | Admitting: Certified Nurse Midwife

## 2014-10-09 NOTE — Progress Notes (Signed)
Patient ID: Ashley SpinnerNancy L Wilbert, female   DOB: 09/18/1990, 24 y.o.   MRN: [id number]   Chief Complaint  Patient presents with  . Follow-up    HPI Ashley Spinnerancy L Pohle is a 24 y.o. female.  No complaints, here for annual exam with her mother and sister.  Has mental impairments, but can answer questions and follow a conversation.  Denies any problems with her menses, on Seasonique.  Tolerating well.  Denies any problems with vaginal discharge.  Not sexually active.  Pap smear last year normal.    HPI  Past Medical History  Diagnosis Date  . ADHD (attention deficit hyperactivity disorder)   . Mood disorder   . Seizures     childhood    Past Surgical History  Procedure Laterality Date  . Tonsillectomy and adenoidectomy      Family History  Problem Relation Age of Onset  . Diabetes Mother   . Asthma Sister   . Stroke Maternal Uncle     Social History History  Substance Use Topics  . Smoking status: Never Smoker   . Smokeless tobacco: Not on file  . Alcohol Use: No    No Known Allergies  Current Outpatient Prescriptions  Medication Sig Dispense Refill  . cetirizine (ZYRTEC) 10 MG tablet Take 10 mg by mouth daily.    . flintstones complete (FLINTSTONES) 60 MG chewable tablet Chew 1 tablet by mouth daily.    . Levonorgestrel-Ethinyl Estradiol (SEASONIQUE) 0.15-0.03 &0.01 MG tablet Take 1 tablet by mouth daily. Take 1 active tablet daily, skip non active pills. 1 Package 11  . NASONEX 50 MCG/ACT nasal spray     . RISPERDAL CONSTA 25 MG injection      No current facility-administered medications for this visit.    Review of Systems Review of Systems Constitutional: negative for fatigue and weight loss Respiratory: negative for cough and wheezing Cardiovascular: negative for chest pain, fatigue and palpitations Gastrointestinal: negative for abdominal pain and change in bowel habits Genitourinary:negative Integument/breast: negative for nipple discharge Musculoskeletal:negative  for myalgias Neurological: negative for gait problems and tremors Behavioral/Psych: negative for abusive relationship, depression Endocrine: negative for temperature intolerance     Blood pressure 129/81, pulse 100, temperature 97.9 F (36.6 C), weight 109.317 kg (241 lb).  Physical Exam Physical Exam General:   alert  Skin:   no rash or abnormalities  Lungs:   clear to auscultation bilaterally  Heart:   regular rate and rhythm, S1, S2 normal, no murmur, click, rub or gallop  Abdomen:  normal findings: no organomegaly, soft, non-tender and no hernia  Pelvis:  deferred    95% of 15 min visit spent on counseling and coordination of care.   Data Reviewed Previous medical hx, labs  Assessment     Normal well woman exam Contraceptive management     Plan    No orders of the defined types were placed in this encounter.   Meds ordered this encounter  Medications  . cetirizine (ZYRTEC) 10 MG tablet    Sig: Take 10 mg by mouth daily.    Follow up as needed/ annual exam.

## 2015-05-13 ENCOUNTER — Encounter: Payer: Medicare Other | Attending: Internal Medicine | Admitting: Dietician

## 2015-05-13 ENCOUNTER — Encounter: Payer: Self-pay | Admitting: Dietician

## 2015-05-13 VITALS — Ht 65.0 in | Wt 239.7 lb

## 2015-05-13 DIAGNOSIS — Z6839 Body mass index (BMI) 39.0-39.9, adult: Secondary | ICD-10-CM | POA: Insufficient documentation

## 2015-05-13 DIAGNOSIS — E669 Obesity, unspecified: Secondary | ICD-10-CM | POA: Diagnosis not present

## 2015-05-13 DIAGNOSIS — Z713 Dietary counseling and surveillance: Secondary | ICD-10-CM | POA: Diagnosis not present
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
## 2015-05-13 NOTE — Patient Instructions (Addendum)
Aim to have 3 meals per day and have 2 snacks if you are hungry.  If you are not hungry in the middle of the day, have a small snack instead of skipping lunch. Ashley [name] agrees to buy chips or candy one time per week. Plan to drink mostly water (limit ginger ale/juice). Aim to fill half of your plate with vegetables. Have protein on a quarter of your plate and starch of a quarter of your plate.  Choose one starch (sandwich bread, fries/potatoes) when you go out to eat. (Grilled chicken with broccoli with fries.) Choose mostly grilled/baked/broiled meat instead of fried.  Have protein and carbs together for snacks (see list). (fruit with peanut butter, nuts, boiled egg) or a yogurt.  Aim to take 20 minutes to eat meals - chew 20-30 x bite and put fork down between bites.

## 2015-05-27 ENCOUNTER — Telehealth: Payer: Self-pay | Admitting: *Deleted

## 2015-05-27 ENCOUNTER — Other Ambulatory Visit: Payer: Self-pay | Admitting: Allergy and Immunology

## 2015-05-27 NOTE — Telephone Encounter (Signed)
Mother is calling to let us know that Tanieka forgot a pill and has been having BTB for about a week. Her withdrawal week is nextand wants to know what to do. Advised mother to continue on with pills and let Harriett Sineancy have her cycle. She may want to give her an iron supplement while she is doing this extra bleeding. Once she finishes her cycle she should start her OCP as scheduled and her bleeding should stop- if it does not please let us know.

## 2015-07-22 ENCOUNTER — Other Ambulatory Visit: Payer: Self-pay | Admitting: Allergy and Immunology

## 2015-08-04 ENCOUNTER — Telehealth: Payer: Self-pay

## 2015-08-04 NOTE — Telephone Encounter (Signed)
na

## 2015-08-14 ENCOUNTER — Emergency Department (HOSPITAL_COMMUNITY)
Admission: EM | Admit: 2015-08-14 | Discharge: 2015-08-14 | Disposition: A | Discharge status: Home / Self Care | Payer: Medicare Other | Attending: Physician Assistant | Admitting: Physician Assistant

## 2015-08-14 ENCOUNTER — Encounter (HOSPITAL_COMMUNITY): Payer: Self-pay | Admitting: Family Medicine

## 2015-08-14 DIAGNOSIS — Z79899 Other long term (current) drug therapy: Secondary | ICD-10-CM | POA: Insufficient documentation

## 2015-08-14 DIAGNOSIS — Y92410 Unspecified street and highway as the place of occurrence of the external cause: Secondary | ICD-10-CM | POA: Insufficient documentation

## 2015-08-14 DIAGNOSIS — S0990XA Unspecified injury of head, initial encounter: Secondary | ICD-10-CM | POA: Insufficient documentation

## 2015-08-14 DIAGNOSIS — Y998 Other external cause status: Secondary | ICD-10-CM | POA: Insufficient documentation

## 2015-08-14 DIAGNOSIS — S161XXA Strain of muscle, fascia and tendon at neck level, initial encounter: Secondary | ICD-10-CM | POA: Diagnosis not present

## 2015-08-14 DIAGNOSIS — S3992XA Unspecified injury of lower back, initial encounter: Secondary | ICD-10-CM | POA: Insufficient documentation

## 2015-08-14 DIAGNOSIS — F909 Attention-deficit hyperactivity disorder, unspecified type: Secondary | ICD-10-CM | POA: Insufficient documentation

## 2015-08-14 DIAGNOSIS — Y9389 Activity, other specified: Secondary | ICD-10-CM | POA: Diagnosis not present

## 2015-08-14 DIAGNOSIS — S199XXA Unspecified injury of neck, initial encounter: Secondary | ICD-10-CM | POA: Diagnosis present

## 2015-08-14 MED ORDER — HYDROCODONE-ACETAMINOPHEN 5-325 MG PO TABS
1.0000 | ORAL_TABLET | Freq: Once | ORAL | Status: AC
  Administered 2015-08-14: 1 via ORAL
  Filled 2015-08-14: qty 1

## 2015-08-14 MED ORDER — CYCLOBENZAPRINE HCL 10 MG PO TABS: 10.0000 mg | ORAL_TABLET | Freq: Two times a day (BID) | ORAL | Status: DC | PRN

## 2015-08-14 MED ORDER — HYDROCODONE-ACETAMINOPHEN 5-325 MG PO TABS
1.0000 | ORAL_TABLET | ORAL | Status: DC | PRN
Start: 2015-08-14 — End: 2016-09-13
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
## 2015-08-14 NOTE — ED Notes (Signed)
Patient states went to urgent care last night and they referred her here for CT.

## 2015-08-14 NOTE — ED Notes (Signed)
Family at bedside. 
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
## 2015-11-05 ENCOUNTER — Encounter (HOSPITAL_COMMUNITY): Payer: Self-pay | Admitting: Emergency Medicine

## 2015-11-05 ENCOUNTER — Emergency Department (INDEPENDENT_AMBULATORY_CARE_PROVIDER_SITE_OTHER)
Admission: EM | Admit: 2015-11-05 | Discharge: 2015-11-05 | Disposition: A | Discharge status: Home / Self Care | Payer: Medicare Other | Source: Home / Self Care | Attending: Emergency Medicine | Admitting: Emergency Medicine

## 2015-11-05 DIAGNOSIS — Z872 Personal history of diseases of the skin and subcutaneous tissue: Secondary | ICD-10-CM

## 2015-11-05 DIAGNOSIS — L089 Local infection of the skin and subcutaneous tissue, unspecified: Secondary | ICD-10-CM

## 2015-11-05 MED ORDER — DOXYCYCLINE HYCLATE 100 MG PO CAPS: 100.0000 mg | ORAL_CAPSULE | Freq: Two times a day (BID) | ORAL | Status: DC

## 2015-11-05 MED ORDER — CHLORHEXIDINE GLUCONATE 4 % EX LIQD: Freq: Every day | CUTANEOUS | Status: DC | PRN

## 2015-11-05 NOTE — ED Provider Notes (Signed)
CSN: [id number]     Arrival date & time 11/05/15  1259 History   First MD Initiated Contact with Patient 11/05/15 1311     Chief Complaint  Patient presents with  . Rash   (Consider location/radiation/quality/duration/timing/severity/associated sxs/prior Treatment) HPI Comments: 25 year old female with a history of pustules presents today with complaints of various small bumps and pustules scattered about her body. There are approximately 5-6 specific lesions that she is concerned about. Many of these or any healing stage and are not infected or producing pus. There is a solitary lesion to the right proximal inner thigh there is a small pustule and draining a miniscule amount of exudate. Most all these lesions are centimeter or much less. Some are tender some are not. There is no erythema. No evidence of cellulitis or lymphangitis. No involvement of the face or around the eyes. No systemic symptoms.   Past Medical History  Diagnosis Date  . ADHD (attention deficit hyperactivity disorder)   . Mood disorder (HCC)   . Seizures (HCC)     childhood   Past Surgical History  Procedure Laterality Date  . Tonsillectomy and adenoidectomy     Family History  Problem Relation Age of Onset  . Diabetes Mother   . Asthma Sister   . Stroke Maternal Uncle    Social History  Substance Use Topics  . Smoking status: Never Smoker   . Smokeless tobacco: None  . Alcohol Use: No   OB History    Gravida Para Term Preterm AB TAB SAB Ectopic Multiple Living   0 0 0 0 0 0 0 0 0 0      Review of Systems  Constitutional: Negative for fever, activity change and fatigue.  HENT: Negative.   Respiratory: Negative.   Genitourinary: Negative.   Skin:       As per history of present illness  Neurological: Negative.     Allergies  Review of patient's allergies indicates no known allergies.  Home Medications   Prior to Admission medications   Medication Sig Start Date End Date Taking? Authorizing  Provider  cetirizine (ZYRTEC) 10 MG tablet Take 10 mg by mouth daily.    Historical Provider, MD  chlorhexidine (HIBICLENS) 4 % external liquid Apply topically daily as needed. Use daily when bathing for 1-2 weeks 11/05/15   Hayden Rasmussenavid Mavi Un, NP  cyclobenzaprine (FLEXERIL) 10 MG tablet Take 1 tablet (10 mg total) by mouth 2 (two) times daily as needed for muscle spasms. 08/14/15   Roxy Horsemanobert Browning, PA-C  desloratadine (CLARINEX) 5 MG tablet take 1 tablet by mouth once daily 05/27/15   Roselyn Kara MeadM Hicks, MD  doxycycline (VIBRAMYCIN) 100 MG capsule Take 1 capsule (100 mg total) by mouth 2 (two) times daily. 11/05/15   Hayden Rasmussenavid Shavar Gorka, NP  flintstones complete (FLINTSTONES) 60 MG chewable tablet Chew 1 tablet by mouth daily.    Historical Provider, MD  HYDROcodone-acetaminophen (NORCO/VICODIN) 5-325 MG tablet Take 1-2 tablets by mouth every 4 (four) hours as needed. 08/14/15   Roxy Horsemanobert Browning, PA-C  INVEGA SUSTENNA 156 MG/ML SUSP  10/01/14   Historical Provider, MD  Levonorgestrel-Ethinyl Estradiol (SEASONIQUE) 0.15-0.03 &0.01 MG tablet Take 1 tablet by mouth daily. Take 1 active tablet daily, skip non active pills. 03/19/14   Antionette CharLisa Jackson-Moore, MD  mometasone (NASONEX) 50 MCG/ACT nasal spray USE [facility] ONCE DAILY FOR STUFFY NOSE OR DRAINAGE 07/22/15   Roselyn Kara MeadM Hicks, MD  RISPERDAL CONSTA 25 MG injection  08/21/13   Historical Provider, MD  Meds Ordered and Administered this Visit  Medications - No data to display  BP 134/73 mmHg  Pulse 86  Temp(Src) 97.9 F (36.6 C) (Oral)  Resp 18  SpO2 100% No data found.   Physical Exam  Constitutional: She appears well-developed and well-nourished. No distress.  Neck: Normal range of motion. Neck supple.  Cardiovascular: Normal rate.   Pulmonary/Chest: No respiratory distress.  Musculoskeletal: She exhibits no edema.  Neurological: She is alert. She exhibits normal muscle tone.  Skin: Skin is warm and dry.  Small dermal lesions as described in  history of present illness. None with induration, erythema, lymphangitis or cellulitis. All are less than 1 cm in size.  Psychiatric: She has a normal mood and affect.  Nursing note and vitals reviewed.   ED Course  Procedures (including critical care time)  Labs Review Labs Reviewed - No data to display  Imaging Review No results found.   Visual Acuity Review  Right Eye Distance:   Left Eye Distance:   Bilateral Distance:    Right Eye Near:   Left Eye Near:    Bilateral Near:         MDM   1. Pustules determined by examination   2. History of psoriasis with pustules    Used Hibiclens to apply and wash her body while taking showers or baths. Warm compresses to areas of bumps Doxycycline twice a day as directed.     Hayden Rasmussen, NP 11/05/15 1328

## 2015-11-05 NOTE — ED Notes (Signed)
PT reports multiple sore areas on skin. She has been told it is MRSA and given a cream.

## 2015-11-05 NOTE — Discharge Instructions (Signed)
Used Hibiclens to apply and wash her body while taking showers or baths. Warm compresses to areas of bumps Doxycycline twice a day as directed.

## 2015-11-10 ENCOUNTER — Telehealth (HOSPITAL_COMMUNITY): Payer: Self-pay | Admitting: Emergency Medicine

## 2016-01-28 ENCOUNTER — Ambulatory Visit (INDEPENDENT_AMBULATORY_CARE_PROVIDER_SITE_OTHER): Payer: Medicare Other | Admitting: Allergy and Immunology

## 2016-01-28 VITALS — BP 126/80 | HR 92 | Resp 16

## 2016-01-28 DIAGNOSIS — J309 Allergic rhinitis, unspecified: Secondary | ICD-10-CM

## 2016-01-28 DIAGNOSIS — H1010 Acute atopic conjunctivitis, unspecified eye: Secondary | ICD-10-CM | POA: Diagnosis not present

## 2016-01-28 MED ORDER — DESLORATADINE 5 MG PO TABS: ORAL_TABLET | ORAL | Status: DC

## 2016-01-28 MED ORDER — MOMETASONE FUROATE 50 MCG/ACT NA SUSP: NASAL | Status: DC

## 2016-01-28 NOTE — Patient Instructions (Signed)
   Continue current medication regime--- Daily Clarinex/Nasonex and as needed Patanase.  Saline nasal wash each evening at shower time.  Follow-up in January 2018 or sooner if needed.

## 2016-01-29 NOTE — Progress Notes (Signed)
     FOLLOW UP NOTE  RE: Ashley [name] MRN: [id number] DOB: 03/05/1991 [facility] [address]  [place] [id number] Date of Office Visit: 01/28/2016  Subjective:  Ashley [name] is a 25 y.o. female who presents today for Allergies  Assessment:   1. Allergic rhinoconjunctivitis   2.      Component of nonallergic rhinitis. Plan:   Meds ordered this encounter  Medications  . desloratadine (CLARINEX) 5 MG tablet    Sig: Take one tablet daily for runny nose or itching.    Dispense:  30 tablet    Refill:  5  . mometasone (NASONEX) 50 MCG/ACT nasal spray    Sig: Use 1-2 sprays in each nostril once daily for stuffy nose or drainage.    Dispense:  17 g    Refill:  4  1.  Continue current medication regime--- Daily Clarinex/Nasonex and as needed Patanase. 2.  Saline nasal wash each evening at shower time. 3.  Follow-up in January 2018 or sooner if needed.  HPI: Ashley [name] returns to the office in follow-up of allergic rhinoconjunctivitis and nonallergic rhinitis.  Since her last visit in July 2016, Mom is very pleased and feels the addition of Clarinex has made a great difference.  Ashley [name] states she feels very good.  There is no sniffling or snoring, headache, sinus pressure, discolored drainage, or other concerning difficulties.  Denies ED or urgent care visits, prednisone or antibiotic courses. Reports sleep and activity are normal.  Ashley [name] has a current medication list which includes the following prescription(s): chlorhexidine, desloratadine, invega sustenna, levonorgestrel-ethinyl estradiol, mometasone nasal spray, olopatadine nasal spray.  Drug Allergies: No Known Allergies  Objective:   Filed Vitals:   01/28/16 1549  BP: 126/80  Pulse: 92  Resp: 16   Physical Exam  Constitutional: She is well-developed, well-nourished, and in no distress.  HENT:  Head: Atraumatic.  Right Ear: Tympanic membrane and ear canal normal.  Left Ear: Tympanic  membrane and ear canal normal.  Nose: Mucosal edema present. No rhinorrhea. No epistaxis.  Mouth/Throat: Oropharynx is clear and moist and mucous membranes are normal. No oropharyngeal exudate, posterior oropharyngeal edema or posterior oropharyngeal erythema.  Neck: Neck supple.  Cardiovascular: Normal rate, S1 normal and S2 normal.   No murmur heard. Pulmonary/Chest: Effort normal. She has no wheezes. She has no rhonchi. She has no rales.  Lymphadenopathy:    She has no cervical adenopathy.     Jeanita Carneiro M. Willa RoughHicks, MD  cc: Janit PaganENIOLA, KEHINDE, MD

## 2016-02-01 ENCOUNTER — Encounter: Payer: Self-pay | Admitting: Allergy and Immunology

## 2016-02-18 ENCOUNTER — Telehealth: Payer: Self-pay | Admitting: *Deleted

## 2016-02-22 ENCOUNTER — Telehealth: Payer: Self-pay | Admitting: *Deleted

## 2016-02-22 DIAGNOSIS — B3731 Acute candidiasis of vulva and vagina: Secondary | ICD-10-CM

## 2016-02-22 DIAGNOSIS — B373 Candidiasis of vulva and vagina: Secondary | ICD-10-CM

## 2016-02-22 MED ORDER — FLUCONAZOLE 150 MG PO TABS: 150.0000 mg | ORAL_TABLET | ORAL | Status: DC

## 2016-02-22 NOTE — Telephone Encounter (Signed)
Patient is special needs- she can't use the cream prescribed. She is still having symptoms.Can she get retreated? Rx for Diflucan sent to pharmacy per protocol.

## 2016-02-24 NOTE — Telephone Encounter (Signed)
See phone note for this encounter. 

## 2016-03-18 ENCOUNTER — Other Ambulatory Visit: Payer: Self-pay | Admitting: *Deleted

## 2016-03-18 ENCOUNTER — Telehealth: Payer: Self-pay | Admitting: Allergy and Immunology

## 2016-03-18 MED ORDER — OLOPATADINE HCL 0.6 % NA SOLN: NASAL | 5 refills | Status: DC

## 2016-03-18 NOTE — Telephone Encounter (Signed)
Pt called and is checking on refills that Dr. Willa RoughHicks was going to call in for her. She said her pharmacy doesn't have them and to check with her doctor again.

## 2016-03-21 ENCOUNTER — Other Ambulatory Visit: Payer: Self-pay

## 2016-03-21 MED ORDER — MOMETASONE FUROATE 50 MCG/ACT NA SUSP: NASAL | 3 refills | Status: DC

## 2016-03-21 MED ORDER — OLOPATADINE HCL 0.6 % NA SOLN: NASAL | 3 refills | Status: DC

## 2016-03-21 NOTE — Telephone Encounter (Signed)
Spoke with mom and sent in her nasal sprays for her

## 2016-03-21 NOTE — Telephone Encounter (Signed)
Lm for pt to call us back to let us know exactly what she needs refilled.

## 2016-05-21 ENCOUNTER — Ambulatory Visit (HOSPITAL_COMMUNITY)
Admission: EM | Admit: 2016-05-21 | Discharge: 2016-05-21 | Disposition: A | Discharge status: Home / Self Care | Payer: Medicare Other | Attending: Internal Medicine | Admitting: Internal Medicine

## 2016-05-21 ENCOUNTER — Encounter (HOSPITAL_COMMUNITY): Payer: Self-pay

## 2016-05-21 DIAGNOSIS — L0291 Cutaneous abscess, unspecified: Secondary | ICD-10-CM

## 2016-05-21 MED ORDER — CHLORHEXIDINE GLUCONATE 4 % EX LIQD: Freq: Every day | CUTANEOUS | 0 refills | Status: DC | PRN

## 2016-05-21 MED ORDER — CEPHALEXIN 500 MG PO CAPS: 500.0000 mg | ORAL_CAPSULE | Freq: Two times a day (BID) | ORAL | 0 refills | Status: DC

## 2016-05-21 NOTE — ED Notes (Signed)
Patient was discharged by Jennifer Omohundro, NP 

## 2016-05-21 NOTE — ED Provider Notes (Signed)
CSN: [id number]     Arrival date & time 05/21/16  1620 History   None    Chief Complaint  Patient presents with  . Cyst   (Consider location/radiation/quality/duration/timing/severity/associated sxs/prior Treatment) 25 y.o. female presents with abscesses to right inner thigh and left buttock. Condition is chronic in nature. Condition is made better by antibiotics. Condition is made worse by nothing. Patient denies any fevers,       Past Medical History:  Diagnosis Date  . ADHD (attention deficit hyperactivity disorder)   . Mood disorder (HCC)   . Seizures (HCC)    childhood   Past Surgical History:  Procedure Laterality Date  . TONSILLECTOMY AND ADENOIDECTOMY     Family History  Problem Relation Age of Onset  . Diabetes Mother   . Asthma Sister   . Stroke Maternal Uncle    Social History  Substance Use Topics  . Smoking status: Never Smoker  . Smokeless tobacco: Never Used  . Alcohol use No   OB History    Gravida Para Term Preterm AB Living   0 0 0 0 0 0   SAB TAB Ectopic Multiple Live Births   0 0 0 0       Review of Systems  Constitutional: Negative.   HENT: Negative.   Skin:       Abscess to right inner thigh and left buttock     Allergies  Review of patient's allergies indicates no known allergies.  Home Medications   Prior to Admission medications   Medication Sig Start Date End Date Taking? Authorizing Provider  desloratadine (CLARINEX) 5 MG tablet Take one tablet daily for runny nose or itching. 01/28/16  Yes Roselyn Kara Mead, MD  Levonorgestrel-Ethinyl Estradiol (SEASONIQUE) 0.15-0.03 &0.01 MG tablet Take 1 tablet by mouth daily. Take 1 active tablet daily, skip non active pills. 03/19/14  Yes Antionette Char, MD  mometasone (NASONEX) 50 MCG/ACT nasal spray Use 1-2 sprays in each nostril once daily for stuffy nose or drainage. 03/21/16  Yes Alfonse Spruce, MD  cephALEXin (KEFLEX) 500 MG capsule Take 1 capsule (500 mg total) by mouth 2 (two)  times daily. 05/21/16   Alene Mires, NP  cetirizine (ZYRTEC) 10 MG tablet Take 10 mg by mouth daily. Reported on 01/28/2016    Historical Provider, MD  chlorhexidine (HIBICLENS) 4 % external liquid Apply topically daily as needed. Use daily when bathing for 1-2 weeks 05/21/16   Alene Mires, NP  cyclobenzaprine (FLEXERIL) 10 MG tablet Take 1 tablet (10 mg total) by mouth 2 (two) times daily as needed for muscle spasms. Patient not taking: Reported on 05/21/2016 08/14/15   Roxy Horseman, PA-C  doxycycline (VIBRAMYCIN) 100 MG capsule Take 1 capsule (100 mg total) by mouth 2 (two) times daily. Patient not taking: Reported on 05/21/2016 11/05/15   Hayden Rasmussen, NP  flintstones complete (FLINTSTONES) 60 MG chewable tablet Chew 1 tablet by mouth daily. Reported on 01/28/2016    Historical Provider, MD  fluconazole (DIFLUCAN) 150 MG tablet Take 1 tablet (150 mg total) by mouth every other day. 02/22/16   Brock Bad, MD  HYDROcodone-acetaminophen (NORCO/VICODIN) 5-325 MG tablet Take 1-2 tablets by mouth every 4 (four) hours as needed. Patient not taking: Reported on 05/21/2016 08/14/15   Roxy Horseman, PA-C  INVEGA SUSTENNA 156 MG/ML SUSP  10/01/14   Historical Provider, MD  olopatadine (PATANOL) 0.1 % ophthalmic solution     Historical Provider, MD  Olopatadine HCl 0.6 % SOLN Use two sprays  in each nostril twice daily as needed 03/21/16   Alfonse SpruceJoel Louis Gallagher, MD  RISPERDAL CONSTA 25 MG injection Reported on 01/28/2016 08/21/13   Historical Provider, MD   Meds Ordered and Administered this Visit  Medications - No data to display  BP 113/67 (BP Location: Left Arm)   Pulse 86   Temp 98.4 F (36.9 C) (Oral)   Resp 12   SpO2 100%  No data found.   Physical Exam  Constitutional: She is oriented to person, place, and time. She appears well-developed and well-nourished.  HENT:  Head: Normocephalic and atraumatic.  Eyes: Conjunctivae are normal.  Neck: Normal range of motion.   Pulmonary/Chest: Effort normal.  Musculoskeletal: Normal range of motion.  Neurological: She is alert and oriented to person, place, and time.  Skin: Skin is warm.  Abscess to right inner thigh and left buttock no erythema noted. Abscess are approximately 0.5 cm in dialmeter and non fluctuant.   Psychiatric: She has a normal mood and affect.  Nursing note and vitals reviewed.   Urgent Care Course   Clinical Course    Procedures (including critical care time)  Labs Review Labs Reviewed - No data to display  Imaging Review No results found.   Visual Acuity Review  Right Eye Distance:   Left Eye Distance:   Bilateral Distance:    Right Eye Near:   Left Eye Near:    Bilateral Near:         MDM   1. Abscess        Alene MiresJennifer C Omohundro, NP 05/21/16 1742

## 2016-05-21 NOTE — ED Triage Notes (Signed)
Patient has hx of MRSA, and has a cyst on legs and buttocks

## 2016-07-05 ENCOUNTER — Other Ambulatory Visit (HOSPITAL_COMMUNITY)
Admission: RE | Admit: 2016-07-05 | Discharge: 2016-07-05 | Disposition: A | Discharge status: Home / Self Care | Payer: Medicare Other | Source: Ambulatory Visit | Attending: Obstetrics & Gynecology | Admitting: Obstetrics & Gynecology

## 2016-07-05 ENCOUNTER — Ambulatory Visit (INDEPENDENT_AMBULATORY_CARE_PROVIDER_SITE_OTHER): Payer: Medicare Other | Admitting: Certified Nurse Midwife

## 2016-07-05 ENCOUNTER — Encounter: Payer: Self-pay | Admitting: Obstetrics & Gynecology

## 2016-07-05 VITALS — BP 117/74 | HR 78 | Temp 98.2°F

## 2016-07-05 DIAGNOSIS — Z01419 Encounter for gynecological examination (general) (routine) without abnormal findings: Secondary | ICD-10-CM

## 2016-07-05 DIAGNOSIS — Z124 Encounter for screening for malignant neoplasm of cervix: Secondary | ICD-10-CM | POA: Insufficient documentation

## 2016-07-05 DIAGNOSIS — E669 Obesity, unspecified: Secondary | ICD-10-CM

## 2016-07-05 DIAGNOSIS — F902 Attention-deficit hyperactivity disorder, combined type: Secondary | ICD-10-CM | POA: Diagnosis not present

## 2016-07-05 DIAGNOSIS — Z3041 Encounter for surveillance of contraceptive pills: Secondary | ICD-10-CM | POA: Diagnosis not present

## 2016-07-05 DIAGNOSIS — N946 Dysmenorrhea, unspecified: Secondary | ICD-10-CM

## 2016-07-05 DIAGNOSIS — IMO0001 Reserved for inherently not codable concepts without codable children: Secondary | ICD-10-CM

## 2016-07-05 MED ORDER — LEVONORGEST-ETH ESTRAD 91-DAY 0.15-0.03 &0.01 MG PO TABS: 1.0000 | ORAL_TABLET | Freq: Every day | ORAL | 4 refills | Status: DC

## 2016-07-05 NOTE — Progress Notes (Signed)
Subjective:        Ashley [name] is a 25 y.o. female here for a routine exam.  No complaints, here for annual exam with her mother and sister.  Has mental impairments, but can answer questions and follow a conversation.  Denies any problems with her menses, on Seasonique.  Tolerating well.  Denies any problems with vaginal discharge.  Not sexually active.  Pap smear two years ago was normal.      Personal health questionnaire:  Is patient Ashkenazi Jewish, have a family history of breast and/or ovarian cancer: no Is there a family history of uterine cancer diagnosed at age < 5950, gastrointestinal cancer, urinary tract cancer, family member who is a Personnel officerLynch syndrome-associated carrier: no Is the patient overweight and hypertensive, family history of diabetes, personal history of gestational diabetes, preeclampsia or PCOS: yes Is patient over 655, have PCOS,  family history of premature CHD under age 24, diabetes, smoke, have hypertension or peripheral artery disease:  no At any time, has a partner hit, kicked or otherwise hurt or frightened you?: no Over the past 2 weeks, have you felt down, depressed or hopeless?: no Over the past 2 weeks, have you felt little interest or pleasure in doing things?:no   Gynecologic History No LMP recorded. Patient is not currently having periods (Reason: Other). Contraception: OCP (estrogen/progesterone) Last Pap: 09/02/13. Results were: normal Last mammogram: n/a.   Obstetric History OB History  Gravida Para Term Preterm AB Living  0 0 0 0 0 0  SAB TAB Ectopic Multiple Live Births  0 0 0 0          Past Medical History:  Diagnosis Date  . ADHD (attention deficit hyperactivity disorder)   . Mood disorder (HCC)   . Seizures (HCC)    childhood    Past Surgical History:  Procedure Laterality Date  . TONSILLECTOMY AND ADENOIDECTOMY       Current Outpatient Prescriptions:  .  desloratadine (CLARINEX) 5 MG tablet, Take one tablet daily for  runny nose or itching., Disp: 30 tablet, Rfl: 5 .  Levonorgestrel-Ethinyl Estradiol (SEASONIQUE) 0.15-0.03 &0.01 MG tablet, Take 1 tablet by mouth daily. Take 1 active tablet daily, skip non active pills., Disp: 3 Package, Rfl: 4 .  RISPERDAL CONSTA 25 MG injection, Reported on 01/28/2016, Disp: , Rfl:  .  cephALEXin (KEFLEX) 500 MG capsule, Take 1 capsule (500 mg total) by mouth 2 (two) times daily., Disp: 20 capsule, Rfl: 0 .  cetirizine (ZYRTEC) 10 MG tablet, Take 10 mg by mouth daily. Reported on 01/28/2016, Disp: , Rfl:  .  chlorhexidine (HIBICLENS) 4 % external liquid, Apply topically daily as needed. Use daily when bathing for 1-2 weeks, Disp: 120 mL, Rfl: 0 .  cyclobenzaprine (FLEXERIL) 10 MG tablet, Take 1 tablet (10 mg total) by mouth 2 (two) times daily as needed for muscle spasms. (Patient not taking: Reported on 05/21/2016), Disp: 20 tablet, Rfl: 0 .  doxycycline (VIBRAMYCIN) 100 MG capsule, Take 1 capsule (100 mg total) by mouth 2 (two) times daily. (Patient not taking: Reported on 05/21/2016), Disp: 20 capsule, Rfl: 0 .  flintstones complete (FLINTSTONES) 60 MG chewable tablet, Chew 1 tablet by mouth daily. Reported on 01/28/2016, Disp: , Rfl:  .  fluconazole (DIFLUCAN) 150 MG tablet, Take 1 tablet (150 mg total) by mouth every other day., Disp: 2 tablet, Rfl: 2 .  HYDROcodone-acetaminophen (NORCO/VICODIN) 5-325 MG tablet, Take 1-2 tablets by mouth every 4 (four) hours as needed. (Patient not taking:  Reported on 05/21/2016), Disp: 10 tablet, Rfl: 0 .  INVEGA SUSTENNA 156 MG/ML SUSP, , Disp: , Rfl: 0 .  mometasone (NASONEX) 50 MCG/ACT nasal spray, Use 1-2 sprays in each nostril once daily for stuffy nose or drainage., Disp: 17 g, Rfl: 3 .  olopatadine (PATANOL) 0.1 % ophthalmic solution, , Disp: , Rfl:  .  Olopatadine HCl 0.6 % SOLN, Use two sprays in each nostril twice daily as needed, Disp: 1 Bottle, Rfl: 3 No Known Allergies  Social History  Substance Use Topics  . Smoking status:  Never Smoker  . Smokeless tobacco: Never Used  . Alcohol use No    Family History  Problem Relation Age of Onset  . Diabetes Mother   . Asthma Sister   . Stroke Maternal Uncle       Review of Systems  Constitutional: negative for fatigue and weight loss Respiratory: negative for cough and wheezing Cardiovascular: negative for chest pain, fatigue and palpitations Gastrointestinal: negative for abdominal pain and change in bowel habits Musculoskeletal:negative for myalgias Neurological: negative for gait problems and tremors Behavioral/Psych: negative for abusive relationship, depression Endocrine: negative for temperature intolerance    Genitourinary:negative for abnormal menstrual periods, genital lesions, hot flashes, sexual problems and vaginal discharge Integument/breast: negative for breast lump, breast tenderness, nipple discharge and skin lesion(s)    Objective:       BP 117/74   Pulse 78   Temp 98.2 F (36.8 C)  General:   alert  Skin:   no rash or abnormalities  Lungs:   clear to auscultation bilaterally  Heart:   regular rate and rhythm, S1, S2 normal, no murmur, click, rub or gallop  Breasts:   normal without suspicious masses, skin or nipple changes or axillary nodes  Abdomen:  normal findings: no organomegaly, soft, non-tender and no hernia  Pelvis:  External genitalia: normal general appearance Urinary system: urethral meatus normal and bladder without fullness, nontender Vaginal: normal without tenderness, induration or masses Cervix: normal appearance Adnexa: normal bimanual exam Uterus: anteverted and non-tender, normal size   Lab Review Urine pregnancy test Labs reviewed yes Radiologic studies reviewed no  50% of 30 min visit spent on counseling and coordination of care.    Assessment:    Healthy female exam.   Contraception management  Mentally handicapped: did well with exam  Plan:    Education reviewed: calcium supplements, depression  evaluation, low fat, low cholesterol diet, safe sex/STD prevention, self breast exams, skin cancer screening and weight bearing exercise. Contraception: OCP (estrogen/progesterone). Follow up in: 1 year.   Meds ordered this encounter  Medications  . Levonorgestrel-Ethinyl Estradiol (SEASONIQUE) 0.15-0.03 &0.01 MG tablet    Sig: Take 1 tablet by mouth daily. Take 1 active tablet daily, skip non active pills.    Dispense:  3 Package    Refill:  4    Pt is taking active pills only.  On a continuous basis.   Orders Placed This Encounter  Procedures  . NuSwab Vaginitis Plus (VG+)

## 2016-07-07 LAB — NUSWAB VAGINITIS PLUS (VG+)
CANDIDA ALBICANS, NAA: NEGATIVE
CHLAMYDIA TRACHOMATIS, NAA: NEGATIVE
Candida glabrata, NAA: NEGATIVE
NEISSERIA GONORRHOEAE, NAA: NEGATIVE
Trich vag by NAA: NEGATIVE

## 2016-07-11 LAB — CYTOLOGY - PAP: Diagnosis: NEGATIVE

## 2016-07-14 ENCOUNTER — Telehealth: Payer: Self-pay | Admitting: *Deleted

## 2016-07-14 ENCOUNTER — Other Ambulatory Visit: Payer: Self-pay | Admitting: Certified Nurse Midwife

## 2016-07-14 DIAGNOSIS — B3731 Acute candidiasis of vulva and vagina: Secondary | ICD-10-CM

## 2016-07-14 DIAGNOSIS — B373 Candidiasis of vulva and vagina: Secondary | ICD-10-CM

## 2016-07-14 MED ORDER — FLUCONAZOLE 200 MG PO TABS: 200.0000 mg | ORAL_TABLET | Freq: Once | ORAL | 0 refills | Status: DC

## 2016-07-14 MED ORDER — FLUCONAZOLE 200 MG PO TABS: 200.0000 mg | ORAL_TABLET | Freq: Once | ORAL | 4 refills | Status: AC

## 2016-07-14 NOTE — Telephone Encounter (Signed)
Patient's mother is calling - she is [facility] ( 200 mg #3 with 4 RF)

## 2016-09-13 ENCOUNTER — Encounter (INDEPENDENT_AMBULATORY_CARE_PROVIDER_SITE_OTHER): Payer: Self-pay

## 2016-09-13 ENCOUNTER — Ambulatory Visit (INDEPENDENT_AMBULATORY_CARE_PROVIDER_SITE_OTHER): Payer: Medicare Other | Admitting: Allergy and Immunology

## 2016-09-13 ENCOUNTER — Encounter: Payer: Self-pay | Admitting: Allergy and Immunology

## 2016-09-13 VITALS — BP 118/78 | HR 72 | Resp 16 | Ht 64.0 in | Wt 236.6 lb

## 2016-09-13 DIAGNOSIS — J3089 Other allergic rhinitis: Secondary | ICD-10-CM | POA: Diagnosis not present

## 2016-09-13 MED ORDER — OLOPATADINE HCL 0.1 % OP SOLN: OPHTHALMIC | 5 refills | Status: DC

## 2016-09-13 NOTE — Progress Notes (Signed)
Follow-up Note  Referring Provider: Doreene Eland, MD Primary Provider: Dorrene German, MD Date of Office Visit: 09/13/2016  Subjective:   Ashley [name] (DOB: February 24, 1991) is a 26 y.o. female who [facility] in re-evaluation of the following:  HPI: Mersadies returns to this clinic in reevaluation of her allergic rhinoconjunctivitis. She has not been seen in this clinic in over a year. Apparently if she uses her medications on a consistent basis she does well with her nasal issue but she has not been very consistent about doing so. She does not appear to have required administration of an antibiotic to treat an episode of sinusitis since she was last seen in this clinic. She has not had the development of any associated atopic disease during that timeframe as well.  Allergies as of 09/13/2016      Reactions   Doxycycline Rash      Medication List      chlorhexidine 4 % external liquid Commonly known as:  HIBICLENS Apply topically daily as needed. Use daily when bathing for 1-2 weeks   desloratadine 5 MG tablet Commonly known as:  CLARINEX Take one tablet daily for runny nose or itching.   INVEGA SUSTENNA 156 MG/ML Susp injection Generic drug:  paliperidone   Levonorgestrel-Ethinyl Estradiol 0.15-0.03 &0.01 MG tablet Commonly known as:  SEASONIQUE Take 1 tablet by mouth daily. Take 1 active tablet daily, skip non active pills.   mometasone 50 MCG/ACT nasal spray Commonly known as:  NASONEX Use 1-2 sprays in each nostril once daily for stuffy nose or drainage.   olopatadine 0.1 % ophthalmic solution Commonly known as:  PATANOL       Past Medical History:  Diagnosis Date  . ADHD (attention deficit hyperactivity disorder)   . Mood disorder (HCC)   . Seizures (HCC)    childhood    Past Surgical History:  Procedure Laterality Date  . ADENOIDECTOMY    . TONSILLECTOMY AND ADENOIDECTOMY      Review of systems negative  except as noted in HPI / PMHx or noted below:  Review of Systems  Constitutional: Negative.   HENT: Negative.   Eyes: Negative.   Respiratory: Negative.   Cardiovascular: Negative.   Gastrointestinal: Negative.   Genitourinary: Negative.   Musculoskeletal: Negative.   Skin: Negative.   Neurological: Negative.   Endo/Heme/Allergies: Negative.   Psychiatric/Behavioral: Negative.      Objective:   Vitals:   09/13/16 1705  BP: 118/78  Pulse: 72  Resp: 16   Height: 5\' 4"  (162.6 cm)  Weight: 236 lb 9.6 oz (107.3 kg)   Physical Exam  Constitutional: She is well-developed, well-nourished, and in no distress.  HENT:  Head: Normocephalic.  Right Ear: Tympanic membrane, external ear and ear canal normal.  Left Ear: Tympanic membrane, external ear and ear canal normal.  Nose: Mucosal edema present. No rhinorrhea.  Mouth/Throat: Uvula is midline, oropharynx is clear and moist and mucous membranes are normal. No oropharyngeal exudate.  Eyes: Conjunctivae are normal.  Neck: Trachea normal. No tracheal tenderness present. No tracheal deviation present. No thyromegaly present.  Cardiovascular: Normal rate, regular rhythm, S1 normal, S2 normal and normal heart sounds.   No murmur heard. Pulmonary/Chest: Breath sounds normal. No stridor. No respiratory distress. She has no wheezes. She has no rales.  Musculoskeletal: She exhibits no edema.  Lymphadenopathy:       Head (right side): No tonsillar adenopathy present.  Head (left side): No tonsillar adenopathy present.    She has no cervical adenopathy.  Neurological: She is alert. Gait normal.  Skin: No rash noted. She is not diaphoretic. No erythema. Nails show no clubbing.  Psychiatric: Mood and affect normal.    Diagnostics: none    Assessment and Plan:   1. Other allergic rhinitis     1. Use a combination of following every day:   A. Nasonex one spray each nostril twice a day  B. Patanase 1 spray each nostril twice a  day  2. If needed may use the following:   A. Patanol one drop each eye twice a day  3. Return to clinic in 6 months or earlier if problem  I will have Harriett Sineancy consistently use a nasal steroid and a nasal antihistamine and will see what kind of response we get over the course of the next several weeks to months regarding control of her atopic upper airway disease. If she has difficulty she'll contact this clinic for further evaluation but if she does well we'll see her back in this clinic in 6 months.  Laurette SchimkeEric Kozlow, MD Allergy / Immunology [facility]

## 2016-09-13 NOTE — Patient Instructions (Addendum)
  1. Use a combination of following every day:   A. Nasonex one spray each nostril twice a day  B. Patanase 1 spray each nostril twice a day  2. If needed may use the following:   A. Patanol one drop each eye twice a day  3. Return to clinic in 6 months or earlier if problem

## 2016-09-14 NOTE — Progress Notes (Signed)
I don't know this patient although I keep getting notes of her from you guys. I am not the referring provider either. Please help make this correction. Thanks.

## 2016-11-19 ENCOUNTER — Ambulatory Visit (HOSPITAL_COMMUNITY)
Admission: EM | Admit: 2016-11-19 | Discharge: 2016-11-19 | Disposition: A | Discharge status: Home / Self Care | Payer: Medicare Other | Attending: Family Medicine | Admitting: Family Medicine

## 2016-11-19 ENCOUNTER — Encounter (HOSPITAL_COMMUNITY): Payer: Self-pay | Admitting: Emergency Medicine

## 2016-11-19 DIAGNOSIS — H10023 Other mucopurulent conjunctivitis, bilateral: Secondary | ICD-10-CM

## 2016-11-19 DIAGNOSIS — J020 Streptococcal pharyngitis: Secondary | ICD-10-CM

## 2016-11-19 MED ORDER — AMOXICILLIN 250 MG/5ML PO SUSR: 250.0000 mg | Freq: Three times a day (TID) | ORAL | 0 refills | Status: DC

## 2016-11-19 MED ORDER — TOBRAMYCIN 0.3 % OP SOLN: 1.0000 [drp] | Freq: Four times a day (QID) | OPHTHALMIC | 0 refills | Status: DC

## 2016-11-19 NOTE — ED Triage Notes (Signed)
PT reports cough, sore throat, hoarseness for 1 week. PT wants to be checked for strep

## 2016-11-19 NOTE — ED Provider Notes (Addendum)
[facility]    CSN: [id number] Arrival date & time: 11/19/16  1820     History   Chief Complaint Chief Complaint  Patient presents with  . URI    HPI Ashley [name] is a 26 y.o. female.   PT reports cough, sore throat, hoarseness for 1 week. PT wants to be checked for strep.  He had sore throat and cough. She also has reddened eyes that are itchy  This patient is disabled.      Past Medical History:  Diagnosis Date  . ADHD (attention deficit hyperactivity disorder)   . Mood disorder (HCC)   . Seizures (HCC)    childhood    Patient Active Problem List   Diagnosis Date Noted  . Dysmenorrhea 01/25/2013  . Obesity 12/21/2011  . Mood disorder 12/21/2011  . Otitis externa 12/21/2011  . Constipation 12/21/2011  . Adiposity 09/15/2010  . ADD (attention deficit disorder) 02/19/2010    Past Surgical History:  Procedure Laterality Date  . ADENOIDECTOMY    . TONSILLECTOMY AND ADENOIDECTOMY      OB History    Gravida Para Term Preterm AB Living   0 0 0 0 0 0   SAB TAB Ectopic Multiple Live Births   0 0 0 0         Home Medications    Prior to Admission medications   Medication Sig Start Date End Date Taking? Authorizing Provider  amoxicillin (AMOXIL) 250 MG/5ML suspension Take 5 mLs (250 mg total) by mouth 3 (three) times daily. 11/19/16   Elvina Sidle, MD  chlorhexidine (HIBICLENS) 4 % external liquid Apply topically daily as needed. Use daily when bathing for 1-2 weeks 05/21/16   Alene Mires, NP  desloratadine (CLARINEX) 5 MG tablet Take one tablet daily for runny nose or itching. 01/28/16   Baxter Hire, MD  INVEGA SUSTENNA 156 MG/ML SUSP  10/01/14   Historical Provider, MD  Levonorgestrel-Ethinyl Estradiol (SEASONIQUE) 0.15-0.03 &0.01 MG tablet Take 1 tablet by mouth daily. Take 1 active tablet daily, skip non active pills. 07/05/16   Rachelle A Denney, CNM  mometasone (NASONEX) 50 MCG/ACT nasal spray Use 1-2 sprays in each  nostril once daily for stuffy nose or drainage. 03/21/16   Alfonse Spruce, MD  olopatadine (PATANOL) 0.1 % ophthalmic solution Can use one drop in each eye twice a day as needed. 09/13/16   Jessica Priest, MD  Olopatadine HCl 0.6 % SOLN Place 1 drop into the nose 2 (two) times daily.     Historical Provider, MD  tobramycin (TOBREX) 0.3 % ophthalmic solution Place 1 drop into both eyes every 6 (six) hours. 11/19/16   Elvina Sidle, MD    Family History Family History  Problem Relation Age of Onset  . Diabetes Mother   . Asthma Sister   . Stroke Maternal Uncle     Social History Social History  Substance Use Topics  . Smoking status: Never Smoker  . Smokeless tobacco: Never Used  . Alcohol use No     Allergies   Doxycycline   Review of Systems Review of Systems  Constitutional: Positive for fever.  HENT: Positive for congestion, sinus pressure and sore throat.   Respiratory: Positive for cough.   Neurological: Positive for headaches.     Physical Exam Triage Vital Signs ED Triage Vitals  Enc Vitals Group     BP 11/19/16 1905 (!) 146/83     Pulse Rate 11/19/16 1905 95  Resp 11/19/16 1905 16     Temp 11/19/16 1905 99.8 F (37.7 C)     Temp Source 11/19/16 1905 Oral     SpO2 11/19/16 1905 100 %     Weight 11/19/16 1916 243 lb (110.2 kg)     Height 11/19/16 1916  (1.651 m)     Head Circumference --      Peak Flow --      Pain Score 11/19/16 1916 10     Pain Loc --      Pain Edu? --      Excl. in GC? --    No data found.   Updated Vital Signs BP (!) 146/83 (BP Location: Left Arm) Comment: notified rn  Pulse 95   Temp 99.8 F (37.7 C) (Oral)   Resp 16   Ht  (1.651 m)   Wt 243 lb (110.2 kg)   LMP 09/21/2016 Comment: 3 month birth control pill pack  SpO2 100%   BMI 40.44 kg/m    Physical Exam  Constitutional: She is oriented to person, place, and time. She appears well-developed and well-nourished.  HENT:  Right Ear: External ear  normal.  Left Ear: External ear normal.  Reddened posterior pharynx  Eyes: Pupils are equal, round, and reactive to light. Right eye exhibits discharge. Left eye exhibits discharge.  Neck: Normal range of motion. Neck supple.  Cardiovascular: Normal rate, regular rhythm and normal heart sounds.   Pulmonary/Chest: Effort normal and breath sounds normal.  Musculoskeletal: Normal range of motion.  Neurological: She is alert and oriented to person, place, and time.  Skin: Skin is warm and dry.  Nursing note and vitals reviewed.    UC Treatments / Results  Labs (all labs ordered are listed, but only abnormal results are displayed) Labs Reviewed - No data to display  EKG  EKG Interpretation None       Radiology No results found.  Procedures Procedures (including critical care time)  Medications Ordered in UC Medications - No data to display   Initial Impression / Assessment and Plan / UC Course  I have reviewed the triage vital signs and the nursing notes.  Pertinent labs & imaging results that were available during my care of the patient were reviewed by me and considered in my medical decision making (see chart for details).     Final Clinical Impressions(s) / UC Diagnoses   Final diagnoses:  Strep throat  Other mucopurulent conjunctivitis of both eyes    New Prescriptions New Prescriptions   AMOXICILLIN (AMOXIL) 250 MG/5ML SUSPENSION    Take 5 mLs (250 mg total) by mouth 3 (three) times daily.   TOBRAMYCIN (TOBREX) 0.3 % OPHTHALMIC SOLUTION    Place 1 drop into both eyes every 6 (six) hours.     Elvina Sidle, MD 11/19/16 1942    Elvina Sidle, MD 11/19/16 9604

## 2016-11-19 NOTE — Discharge Instructions (Signed)
Come back to see me if you are not better by Wednesday.

## 2016-12-22 ENCOUNTER — Telehealth: Payer: Self-pay | Admitting: *Deleted

## 2016-12-22 NOTE — Telephone Encounter (Signed)
Call placed to Medicare in order to get PA coveage on pt Seasonique. PA was submitted and approved. Pharmacy made aware.

## 2016-12-27 ENCOUNTER — Encounter: Payer: Self-pay | Admitting: Allergy and Immunology

## 2016-12-27 ENCOUNTER — Ambulatory Visit (INDEPENDENT_AMBULATORY_CARE_PROVIDER_SITE_OTHER): Payer: Medicare Other | Admitting: Allergy and Immunology

## 2016-12-27 VITALS — BP 120/62 | HR 80 | Resp 18

## 2016-12-27 DIAGNOSIS — J3089 Other allergic rhinitis: Secondary | ICD-10-CM | POA: Diagnosis not present

## 2016-12-27 DIAGNOSIS — H1045 Other chronic allergic conjunctivitis: Secondary | ICD-10-CM

## 2016-12-27 DIAGNOSIS — H1010 Acute atopic conjunctivitis, unspecified eye: Secondary | ICD-10-CM

## 2016-12-27 NOTE — Progress Notes (Signed)
Follow-up Note  Referring Provider: Fleet ContrasAvbuere, Edwin, MD Primary Provider: Fleet ContrasAvbuere, Edwin, MD Date of Office Visit: 12/27/2016  Subjective:   Ashley [name] (DOB: 05/20/1991) is a 26 y.o. female who [facility] 12[facility]2016 in re-evaluation of the following:  HPI: Ashley [name]. I last saw her in this clinic February 2018.  While consistently using medical therapy which includes a nasal antihistamine and intranasal steroid and an occasional eyedrop she has really done quite well regarding nasal and eye symptoms and is very satisfied with the response that she has received to treat this issue. She has not required an antibiotic to treat an episode of sinusitis since I have last seen her in this clinic.  Allergies as of 12/27/2016      Reactions   Doxycycline Rash      Medication List      chlorhexidine 4 % external liquid Commonly known as:  HIBICLENS Apply topically daily as needed. Use daily when bathing for 1-2 weeks   desloratadine 5 MG tablet Commonly known as:  CLARINEX Take one tablet daily for runny nose or itching.   INVEGA SUSTENNA 156 MG/ML Susp injection Generic drug:  paliperidone   Levonorgestrel-Ethinyl Estradiol 0.15-0.03 &0.01 MG tablet Commonly known as:  SEASONIQUE Take 1 tablet by mouth daily. Take 1 active tablet daily, skip non active pills.   mometasone 50 MCG/ACT nasal spray Commonly known as:  NASONEX Use 1-2 sprays in each nostril once daily for stuffy nose or drainage.   olopatadine 0.1 % ophthalmic solution Commonly known as:  PATANOL Can use one drop in each eye twice a day as needed.   PATANASE 0.6 % Soln Generic drug:  Olopatadine HCl Place 2 puffs into the nose daily.       Past Medical History:  Diagnosis Date  . ADHD (attention deficit hyperactivity disorder)   . Mood disorder (HCC)   . Seizures (HCC)    childhood    Past  Surgical History:  Procedure Laterality Date  . ADENOIDECTOMY    . TONSILLECTOMY AND ADENOIDECTOMY      Review of systems negative except as noted in HPI / PMHx or noted below:  Review of Systems  Constitutional: Negative.   HENT: Negative.   Eyes: Negative.   Respiratory: Negative.   Cardiovascular: Negative.   Gastrointestinal: Negative.   Genitourinary: Negative.   Musculoskeletal: Negative.   Skin: Negative.   Neurological: Negative.   Endo/Heme/Allergies: Negative.   Psychiatric/Behavioral: Negative.      Objective:   Vitals:   12/27/16 1458  BP: 120/62  Pulse: 80  Resp: 18          Physical Exam  Constitutional: She is well-developed, well-nourished, and in no distress.  HENT:  Head: Normocephalic.  Right Ear: Tympanic membrane, external ear and ear canal normal.  Left Ear: Tympanic membrane, external ear and ear canal normal.  Nose: Nose normal. No mucosal edema or rhinorrhea.  Mouth/Throat: Uvula is midline, oropharynx is clear and moist and mucous membranes are normal. No oropharyngeal exudate.  Eyes: Conjunctivae are normal.  Neck: Trachea normal. No tracheal tenderness present. No tracheal deviation present. No thyromegaly present.  Cardiovascular: Normal rate, regular rhythm, S1 normal, S2 normal and normal heart sounds.   No murmur heard. Pulmonary/Chest: Breath sounds normal. No stridor. No respiratory distress. She has no wheezes. She has no rales.  Musculoskeletal: She exhibits no edema.  Lymphadenopathy:  Head (right side): No tonsillar adenopathy present.       Head (left side): No tonsillar adenopathy present.    She has no cervical adenopathy.  Neurological: She is alert. Gait normal.  Skin: No rash noted. She is not diaphoretic. No erythema. Nails show no clubbing.  Psychiatric: Mood and affect normal.    Diagnostics: none   Assessment and Plan:   1. Other allergic rhinitis   2. Seasonal allergic conjunctivitis     1. Use a  combination of following every day:   A. Nasonex one spray each nostril twice a day  B. Patanase 1 spray each nostril twice a day  2. If needed may use the following:   A. Patanol one drop each eye twice a day  3. Return to clinic in 6 months or earlier if problem  [name] is really doing very well on her current medical therapy and she will return to this clinic in 6 months or earlier while continuing to use the plan that resulted in very good control of her atopic disease to date.  Laurette Schimke, MD Allergy / Immunology [facility]

## 2016-12-27 NOTE — Patient Instructions (Signed)
  1. Use a combination of following every day:   A. Nasonex one spray each nostril twice a day  B. Patanase 1 spray each nostril twice a day  2. If needed may use the following:   A. Patanol one drop each eye twice a day  3. Return to clinic in 6 months or earlier if problem

## 2017-03-20 ENCOUNTER — Encounter (HOSPITAL_COMMUNITY): Payer: Self-pay | Admitting: Emergency Medicine

## 2017-03-20 ENCOUNTER — Emergency Department (HOSPITAL_COMMUNITY)
Admission: EM | Admit: 2017-03-20 | Discharge: 2017-03-20 | Disposition: A | Discharge status: Home / Self Care | Payer: Medicare Other | Attending: Emergency Medicine | Admitting: Emergency Medicine

## 2017-03-20 DIAGNOSIS — Z79899 Other long term (current) drug therapy: Secondary | ICD-10-CM | POA: Insufficient documentation

## 2017-03-20 DIAGNOSIS — B349 Viral infection, unspecified: Secondary | ICD-10-CM | POA: Insufficient documentation

## 2017-03-20 DIAGNOSIS — J029 Acute pharyngitis, unspecified: Secondary | ICD-10-CM | POA: Insufficient documentation

## 2017-03-20 LAB — RAPID STREP SCREEN (MED CTR MEBANE ONLY): STREPTOCOCCUS, GROUP A SCREEN (DIRECT): NEGATIVE

## 2017-03-20 NOTE — ED Notes (Signed)
Pt departed in NAD.  

## 2017-03-20 NOTE — Discharge Instructions (Signed)
Please read and follow all provided instructions.  Your diagnoses today include:  1. Viral pharyngitis     You appear to have an upper respiratory infection (URI). An upper respiratory tract infection, or cold, is a viral infection of the air passages leading to the lungs. It should improve gradually after 5-7 days. You may have a lingering cough that lasts for 2- 4 weeks after the infection.  Tests performed today include: Vital signs. See below for your results today.   Medications prescribed:   Take any prescribed medications only as directed. Treatment for your infection is aimed at treating the symptoms. There are no medications, such as antibiotics, that will cure your infection.   Home care instructions:  Follow any educational materials contained in this packet.   Your illness is contagious and can be spread to others, especially during the first 3 or 4 days. It cannot be cured by antibiotics or other medicines. Take basic precautions such as washing your hands often, covering your mouth when you cough or sneeze, and avoiding public places where you could spread your illness to others.   Please continue drinking plenty of fluids.  Use over-the-counter medicines as needed as directed on packaging for symptom relief.  You may also use ibuprofen or tylenol as directed on packaging for pain or fever.  Do not take multiple medicines containing Tylenol or acetaminophen to avoid taking too much of this medication.  Follow-up instructions: Please follow-up with your primary care provider in the next 3 days for further evaluation of your symptoms if you are not feeling better.   Return instructions:  Please return to the Emergency Department if you experience worsening symptoms.  RETURN IMMEDIATELY IF you develop shortness of breath, confusion or altered mental status, a new rash, become dizzy, faint, or poorly responsive, or are unable to be cared for at home. Please return if you have  persistent vomiting and cannot keep down fluids or develop a fever that is not controlled by tylenol or motrin.   Please return if you have any other emergent concerns.  Additional Information:  Your vital signs today were: BP 123/84 (BP Location: Left Arm)    Pulse 77    Temp 97.8 F (36.6 C) (Oral)    Resp 18    LMP 03/06/2017 (Exact Date)    SpO2 100%  If your blood pressure (BP) was elevated above 135/85 this visit, please have this repeated by your doctor within one month. --------------

## 2017-03-20 NOTE — ED Provider Notes (Signed)
[facility]EMER[facility]ENCY DEPT Provider Note   CSN: [id number] Arrival date & time: 03/20/17  [id number]     History   Chief Complaint Chief Complaint  Patient presents with  . Sore Throat    HPI Ashley [name] is a 26 y.o. female.  HPI  26 y.o. female, presents to the Emergency Department today due to sore throat yesterday. Itching. No redness. No fever. Able to tolerate PO. Denies pain. NO pain with ROM of neck. Notes sick contacts with sister. No CP/SOB/ABD pain. NO cough. No rhinorrhea. No meds PTA. No other symptoms noted   Past Medical History:  Diagnosis Date  . ADHD (attention deficit hyperactivity disorder)   . Mood disorder (HCC)   . Seizures (HCC)    childhood    Patient Active Problem List   Diagnosis Date Noted  . Dysmenorrhea 01/25/2013  . Obesity 12/21/2011  . Mood disorder 12/21/2011  . Otitis externa 12/21/2011  . Constipation 12/21/2011  . Adiposity 09/15/2010  . ADD (attention deficit disorder) 02/19/2010    Past Surgical History:  Procedure Laterality Date  . ADENOIDECTOMY    . TONSILLECTOMY AND ADENOIDECTOMY      OB History    Gravida Para Term Preterm AB Living   0 0 0 0 0 0   SAB TAB Ectopic Multiple Live Births   0 0 0 0         Home Medications    Prior to Admission medications   Medication Sig Start Date End Date Taking? Authorizing Provider  amoxicillin (AMOXIL) 250 MG/5ML suspension Take 5 mLs (250 mg total) by mouth 3 (three) times daily. 11/19/16   Elvina SidleLauenstein, Kurt, MD  chlorhexidine (HIBICLENS) 4 % external liquid Apply topically daily as needed. Use daily when bathing for 1-2 weeks 05/21/16   Alene Miresmohundro, Jennifer C, NP  desloratadine (CLARINEX) 5 MG tablet Take one tablet daily for runny nose or itching. 01/28/16   Baxter HireHicks, Roselyn M, MD  INVEGA SUSTENNA 156 MG/ML SUSP  10/01/14   [provider]  Levonorgestrel-Ethinyl Estradiol (SEASONIQUE) 0.15-0.03 &0.01 MG tablet Take 1 tablet by mouth daily. Take 1 active tablet daily, skip non  active pills. 07/05/16   Orvilla Cornwallenney, Rachelle A, CNM  mometasone (NASONEX) 50 MCG/ACT nasal spray Use 1-2 sprays in each nostril once daily for stuffy nose or drainage. 03/21/16   Alfonse SpruceGallagher, Joel Louis, MD  olopatadine (PATANOL) 0.1 % ophthalmic solution Can use one drop in each eye twice a day as needed. 09/13/16   Kozlow, Alvira PhilipsEric J, MD  Olopatadine HCl (PATANASE) 0.6 % SOLN Place 2 puffs into the nose daily.    [provider]    Family History Family History  Problem Relation Age of Onset  . Diabetes Mother   . Asthma Sister   . Stroke Maternal Uncle     Social History Social History  Substance Use Topics  . Smoking status: Never Smoker  . Smokeless tobacco: Never Used  . Alcohol use No     Allergies   Doxycycline   Review of Systems Review of Systems  Constitutional: Negative for fever.  HENT: Positive for sore throat. Negative for congestion and rhinorrhea.   Respiratory: Negative for shortness of breath.   Cardiovascular: Negative for chest pain.  Gastrointestinal: Negative for nausea and vomiting.  Allergic/Immunologic: Negative for immunocompromised state.     Physical Exam Updated Vital Signs BP 123/84 (BP Location: Left Arm)   Pulse 77   Temp 97.8 F (36.6 C) (Oral)   Resp 18   LMP  03/06/2017 (Exact Date)   SpO2 100%   Physical Exam  Constitutional: She is oriented to person, place, and time. She appears well-developed and well-nourished. No distress.  HENT:  Head: Normocephalic and atraumatic.  Right Ear: Tympanic membrane, external ear and ear canal normal.  Left Ear: Tympanic membrane, external ear and ear canal normal.  Nose: Nose normal.  Mouth/Throat: Uvula is midline, oropharynx is clear and moist and mucous membranes are normal. No trismus in the jaw. No oropharyngeal exudate, posterior oropharyngeal erythema or tonsillar abscesses.  Eyes: Pupils are equal, round, and reactive to light. EOM are normal.  Neck: Normal range of motion. Neck  supple. No tracheal deviation present.  Cardiovascular: Normal rate, regular rhythm, S1 normal, S2 normal, normal heart sounds, intact distal pulses and normal pulses.   Pulmonary/Chest: Effort normal and breath sounds normal. No respiratory distress. She has no decreased breath sounds. She has no wheezes. She has no rhonchi. She has no rales.  Abdominal: Normal appearance and bowel sounds are normal. There is no tenderness.  Musculoskeletal: Normal range of motion.  Neurological: She is alert and oriented to person, place, and time.  Skin: Skin is warm and dry.  Psychiatric: She has a normal mood and affect. Her speech is normal and behavior is normal. Thought content normal.     ED Treatments / Results  Labs (all labs ordered are listed, but only abnormal results are displayed) Labs Reviewed  RAPID STREP SCREEN (NOT AT [facility])  CULTURE, GROUP A STREP [facility])    EKG  EKG Interpretation None       Radiology No results found.  Procedures Procedures (including critical care time)  Medications Ordered in ED Medications - No data to display   Initial Impression / Assessment and Plan / ED Course  I have reviewed the triage vital signs and the nursing notes.  Pertinent labs & imaging results that were available during my care of the patient were reviewed by me and considered in my medical decision making (see chart for details).  Final Clinical Impressions(s) / ED Diagnoses  {I have reviewed and evaluated the relevant laboratory values.   {I have reviewed the relevant previous healthcare records.  {I obtained HPI from historian.   ED Course:  Assessment: Pt is a 26 y.o. female presents with sore throat since yesterday . On exam, pt in NAD. VSS. Afebrile. Lungs CTA, Heart RRR. Abdomen nontender/soft. Rapid strep negative. Patients symptoms are consistent with URI, likely viral etiology. Discussed that antibiotics are not indicated for viral infections. Pt will be discharged with  symptomatic treatment.  Verbalizes understanding and is agreeable with plan. Pt is hemodynamically stable & in NAD prior to dc.  Disposition/Plan:  DC Home Additional Verbal discharge instructions given and discussed with patient.  Pt Instructed to f/u with PCP in the next week for evaluation and treatment of symptoms. Return precautions given Pt acknowledges and agrees with plan  Supervising Physician Rancour, Jeannett Senior, MD  Final diagnoses:  Viral pharyngitis    New Prescriptions New Prescriptions   No medications on file     Wilber Bihari 03/20/17 1610    Glynn Octave, MD 03/20/17 [phone]

## 2017-03-20 NOTE — ED Triage Notes (Signed)
Patient with scratchy sore throat since yesterday.  Mom states she has allergies and always congested.

## 2017-03-21 ENCOUNTER — Emergency Department (HOSPITAL_COMMUNITY)
Admission: EM | Admit: 2017-03-21 | Discharge: 2017-03-21 | Disposition: A | Discharge status: Home / Self Care | Payer: Medicare Other | Attending: Emergency Medicine | Admitting: Emergency Medicine

## 2017-03-21 ENCOUNTER — Encounter (HOSPITAL_COMMUNITY): Payer: Self-pay | Admitting: *Deleted

## 2017-03-21 DIAGNOSIS — J0100 Acute maxillary sinusitis, unspecified: Secondary | ICD-10-CM | POA: Insufficient documentation

## 2017-03-21 DIAGNOSIS — R0981 Nasal congestion: Secondary | ICD-10-CM | POA: Diagnosis present

## 2017-03-21 DIAGNOSIS — Z79899 Other long term (current) drug therapy: Secondary | ICD-10-CM | POA: Diagnosis not present

## 2017-03-21 MED ORDER — AMOXICILLIN-POT CLAVULANATE 875-125 MG PO TABS: 1.0000 | ORAL_TABLET | Freq: Two times a day (BID) | ORAL | 0 refills | Status: AC

## 2017-03-21 NOTE — Discharge Instructions (Signed)
As discussed, make sure she takes the entire course of antibiotics even if she feels better. Make sure that she stays well-hydrated. Continue using nasal sprays. Follow-up with her primary care provider.  Return if symptoms worsen, fever, chills, worsening cough, difficulty breathing or any other new concerning symptoms in the meantime.

## 2017-03-21 NOTE — ED Notes (Signed)
Pt and family given blankets.

## 2017-03-21 NOTE — ED Notes (Signed)
Pt reports cough, sore throat, and congestion x 4 days. Pt also report HA, lack of appetite, and nose bleeds. Pt mother reports pt sister seen for same thing and given prednisone and abx. Pt mother requesting same for pt.

## 2017-03-21 NOTE — ED Provider Notes (Signed)
[facility]PT Provider Note   CSN: [id number] Arrival date & time: 03/21/17  2038  By signing my name below, I, Diona BrownerJennifer Gorman, attest that this documentation has been prepared under the direction and in the presence of Mathews RobinsonsJessica Mitchell, PA-C. Electronically Signed: Diona BrownerJennifer Gorman, ED Scribe. 03/21/17. 11:29 PM.  History   Chief Complaint Chief Complaint  Patient presents with  . Nasal Congestion    HPI Ashley [name] is a 26 y.o. female who presents to the Emergency Department complaining of a gradually worsening nasal congestion that started ~ 5 days ago, Friday, 03/17/17. Pt states she is unable to breath through her nose. Associated sx include facial pressure, sore throat, cough, rhinorrhea, nosebleeds, and decreased appetite. Was seen in the ED on 03/20/17 for same and was dx with viral pharyngitis. Per pt's mother she has tried "everything" without relief. MOP would like antibiotics for pt because pt's sister was also seen for similar sx and was given a z-pak and prednisone. The mother notes they keep passing it back and forth between each other. Pt denies fever, chills, or any other complaints at this time.  The history is provided by the patient and a parent. No language interpreter was used.    Past Medical History:  Diagnosis Date  . ADHD (attention deficit hyperactivity disorder)   . Mood disorder (HCC)   . Seizures (HCC)    childhood    Patient Active Problem List   Diagnosis Date Noted  . Dysmenorrhea 01/25/2013  . Obesity 12/21/2011  . Mood disorder 12/21/2011  . Otitis externa 12/21/2011  . Constipation 12/21/2011  . Adiposity 09/15/2010  . ADD (attention deficit disorder) 02/19/2010    Past Surgical History:  Procedure Laterality Date  . ADENOIDECTOMY    . TONSILLECTOMY AND ADENOIDECTOMY      OB History    Gravida Para Term Preterm AB Living   0 0 0 0 0 0   SAB TAB Ectopic Multiple Live Births   0 0 0 0         Home Medications    Prior  to Admission medications   Medication Sig Start Date End Date Taking? Authorizing Provider  amoxicillin (AMOXIL) 250 MG/5ML suspension Take 5 mLs (250 mg total) by mouth 3 (three) times daily. 11/19/16   Elvina SidleLauenstein, Kurt, MD  amoxicillin-clavulanate (AUGMENTIN) 875-125 MG tablet Take 1 tablet by mouth 2 (two) times daily. 03/21/17 03/28/17  Mathews RobinsonsMitchell, Jessica B, PA-C  chlorhexidine (HIBICLENS) 4 % external liquid Apply topically daily as needed. Use daily when bathing for 1-2 weeks 05/21/16   Alene Miresmohundro, Jennifer C, NP  desloratadine (CLARINEX) 5 MG tablet Take one tablet daily for runny nose or itching. 01/28/16   Baxter HireHicks, Roselyn M, MD  INVEGA SUSTENNA 156 MG/ML SUSP  10/01/14   [provider]  Levonorgestrel-Ethinyl Estradiol (SEASONIQUE) 0.15-0.03 &0.01 MG tablet Take 1 tablet by mouth daily. Take 1 active tablet daily, skip non active pills. 07/05/16   Orvilla Cornwallenney, Rachelle A, CNM  mometasone (NASONEX) 50 MCG/ACT nasal spray Use 1-2 sprays in each nostril once daily for stuffy nose or drainage. 03/21/16   Alfonse SpruceGallagher, Joel Louis, MD  olopatadine (PATANOL) 0.1 % ophthalmic solution Can use one drop in each eye twice a day as needed. 09/13/16   Kozlow, Alvira PhilipsEric J, MD  Olopatadine HCl (PATANASE) 0.6 % SOLN Place 2 puffs into the nose daily.    [provider]    Family History Family History  Problem Relation Age of Onset  . Diabetes Mother   .  Asthma Sister   . Stroke Maternal Uncle     Social History Social History  Substance Use Topics  . Smoking status: Never Smoker  . Smokeless tobacco: Never Used  . Alcohol use No     Allergies   Doxycycline   Review of Systems Review of Systems  Constitutional: Positive for appetite change. Negative for chills and fever.  HENT: Positive for congestion, nosebleeds, rhinorrhea, sinus pain, sinus pressure and sore throat.   Respiratory: Positive for cough. Negative for choking, chest tightness, shortness of breath, wheezing and stridor.     Cardiovascular: Negative for chest pain.  Gastrointestinal: Negative for abdominal pain, nausea and vomiting.  Genitourinary: Negative for difficulty urinating and dysuria.  Musculoskeletal: Negative for arthralgias, back pain, gait problem, joint swelling, myalgias, neck pain and neck stiffness.  Skin: Negative for color change, pallor and rash.  Neurological: Negative for dizziness and light-headedness.     Physical Exam Updated Vital Signs BP 127/86 (BP Location: Right Arm)   Pulse 72   Temp 98.9 F (37.2 C) (Oral)   Resp 18   LMP 03/06/2017 (Exact Date)   SpO2 99%   Physical Exam  Constitutional: She appears well-developed and well-nourished. No distress.  Patient is afebrile, non-toxic appearing, sitting comfortably in chair in no acute distress.  HENT:  Head: Normocephalic and atraumatic.  Mouth/Throat: Oropharynx is clear and moist. No oropharyngeal exudate.  No gross oral abscess. Uvula is midline, arches are simmetrical and intact. No peritonsilar swelling or exudate. No trismus. Sublingual mucosa is soft and non-tender. Tolerating oral secretions. No concern for ludwig's angina. Patient is tender to palpation of the maxillary sinus.  Eyes: Conjunctivae and EOM are normal. Right eye exhibits no discharge. Left eye exhibits no discharge.  Neck: Normal range of motion. Neck supple.  Cardiovascular: Normal rate, regular rhythm, normal heart sounds and intact distal pulses.   Pulmonary/Chest: Effort normal and breath sounds normal. No respiratory distress. She has no wheezes. She has no rales. She exhibits no tenderness.  Abdominal: She exhibits no distension.  Musculoskeletal: Normal range of motion.  Lymphadenopathy:    She has no cervical adenopathy.  Neurological: She is alert.  Skin: Skin is warm and dry. No rash noted. She is not diaphoretic. No erythema. No pallor.  Psychiatric: She has a normal mood and affect. Her behavior is normal.  Nursing note and vitals  reviewed.    ED Treatments / Results  DIAGNOSTIC STUDIES: Oxygen Saturation is 99% on RA, normal by my interpretation.   COORDINATION OF CARE: 11:29 PM-Discussed next steps with pt. Pt verbalized understanding and is agreeable with the plan.   Labs (all labs ordered are listed, but only abnormal results are displayed) Labs Reviewed - No data to display  EKG  EKG Interpretation None       Radiology No results found.  Procedures Procedures (including critical care time)  Medications Ordered in ED Medications - No data to display   Initial Impression / Assessment and Plan / ED Course  I have reviewed the triage vital signs and the nursing notes.  Pertinent labs & imaging results that were available during my care of the patient were reviewed by me and considered in my medical decision making (see chart for details).    Patient presents with symptoms consistent with sinusitis. Strep negative yesterday. Mom stated that she did not think that they did a probably and got a good sample. She is convinced that this is a bacterial infection.  Offered to repeat  rapid strep and obtain chest x-ray due to productive cough. Patient and parent refused.  Will discharge home with Augmentin and close follow-up with PCP. Advised to stay well-hydrated and continue with conservative measures to help soothe her throat.  Discussed strict return precautions and advised to return to the emergency department if experiencing any new or worsening symptoms. Instructions were understood and patient agreed with discharge plan.  Final Clinical Impressions(s) / ED Diagnoses   Final diagnoses:  Acute non-recurrent maxillary sinusitis    New Prescriptions Discharge Medication List as of 03/21/2017 11:43 PM    START taking these medications   Details  amoxicillin-clavulanate (AUGMENTIN) 875-125 MG tablet Take 1 tablet by mouth 2 (two) times daily., Starting Tue 03/21/2017, Until Tue 03/28/2017,  Print       I personally performed the services described in this documentation, which was scribed in my presence. The recorded information has been reviewed and is accurate.     Georgiana Shore, PA-C 03/22/17 0102    Zadie Rhine, MD 03/22/17 [phone]

## 2017-03-21 NOTE — ED Triage Notes (Signed)
Pt brought in by mother for congestion and sore throat. Mother requesting antibiotics for sinus infection. Pt c/o headache and rhinorrhea

## 2017-03-22 LAB — CULTURE, GROUP A STREP (THRC)

## 2017-06-26 ENCOUNTER — Telehealth: Payer: Self-pay

## 2017-06-26 ENCOUNTER — Other Ambulatory Visit: Payer: Self-pay | Admitting: Certified Nurse Midwife

## 2017-06-26 MED ORDER — LEVONORGEST-ETH ESTRAD 91-DAY 0.15-0.03 &0.01 MG PO TABS: 1.0000 | ORAL_TABLET | Freq: Every day | ORAL | 4 refills | Status: DC

## 2017-06-26 NOTE — Telephone Encounter (Signed)
Mom informed that insurance will no longer cover seasonique. Boykin ReaperRachelle has prescribed a different birth control

## 2017-07-07 ENCOUNTER — Telehealth: Payer: Self-pay | Admitting: Internal Medicine

## 2017-07-07 NOTE — Telephone Encounter (Signed)
Is requesting Dr. Crawford to take on as a patient.  °

## 2017-07-11 NOTE — Telephone Encounter (Signed)
Fine

## 2017-07-13 NOTE — Telephone Encounter (Signed)
Patient scheduled.

## 2017-07-24 ENCOUNTER — Ambulatory Visit: Payer: Medicare Other | Admitting: Internal Medicine

## 2017-07-24 ENCOUNTER — Other Ambulatory Visit (INDEPENDENT_AMBULATORY_CARE_PROVIDER_SITE_OTHER): Payer: Medicare Other

## 2017-07-24 ENCOUNTER — Ambulatory Visit (INDEPENDENT_AMBULATORY_CARE_PROVIDER_SITE_OTHER): Payer: Medicare Other | Admitting: Internal Medicine

## 2017-07-24 ENCOUNTER — Encounter: Payer: Self-pay | Admitting: Internal Medicine

## 2017-07-24 VITALS — BP 110/88 | HR 88 | Temp 98.4°F | Ht 65.0 in | Wt 237.0 lb

## 2017-07-24 DIAGNOSIS — J3089 Other allergic rhinitis: Secondary | ICD-10-CM | POA: Diagnosis not present

## 2017-07-24 DIAGNOSIS — F39 Unspecified mood [affective] disorder: Secondary | ICD-10-CM | POA: Diagnosis not present

## 2017-07-24 DIAGNOSIS — Z0000 Encounter for general adult medical examination without abnormal findings: Secondary | ICD-10-CM | POA: Diagnosis not present

## 2017-07-24 DIAGNOSIS — J309 Allergic rhinitis, unspecified: Secondary | ICD-10-CM | POA: Insufficient documentation

## 2017-07-24 DIAGNOSIS — E669 Obesity, unspecified: Secondary | ICD-10-CM

## 2017-07-24 DIAGNOSIS — Z6839 Body mass index (BMI) 39.0-39.9, adult: Secondary | ICD-10-CM

## 2017-07-24 DIAGNOSIS — K5900 Constipation, unspecified: Secondary | ICD-10-CM | POA: Diagnosis not present

## 2017-07-24 LAB — CBC
HEMATOCRIT: 40.7 % (ref 36.0–46.0)
Hemoglobin: 13.7 g/dL (ref 12.0–15.0)
MCHC: 33.5 g/dL (ref 30.0–36.0)
MCV: 93.7 fl (ref 78.0–100.0)
PLATELETS: 237 10*3/uL (ref 150.0–400.0)
RBC: 4.35 Mil/uL (ref 3.87–5.11)
RDW: 13.3 % (ref 11.5–15.5)
WBC: 7.7 10*3/uL (ref 4.0–10.5)

## 2017-07-24 LAB — COMPREHENSIVE METABOLIC PANEL
ALBUMIN: 4.2 g/dL (ref 3.5–5.2)
ALK PHOS: 51 U/L (ref 39–117)
ALT: 21 U/L (ref 0–35)
AST: 27 U/L (ref 0–37)
BUN: 13 mg/dL (ref 6–23)
CALCIUM: 9.3 mg/dL (ref 8.4–10.5)
CHLORIDE: 107 meq/L (ref 96–112)
CO2: 25 mEq/L (ref 19–32)
CREATININE: 0.82 mg/dL (ref 0.40–1.20)
GFR: 108.91 mL/min (ref >60.00)
Glucose, Bld: 89 mg/dL (ref 70–99)
POTASSIUM: 3.9 meq/L (ref 3.5–5.1)
Sodium: 138 mEq/L (ref 135–145)
Total Bilirubin: 0.6 mg/dL (ref 0.2–1.2)
Total Protein: 7.7 g/dL (ref 6.0–8.3)

## 2017-07-24 LAB — LIPID PANEL
CHOLESTEROL: 158 mg/dL (ref 0–200)
HDL: 36.5 mg/dL — ABNORMAL LOW (ref >39.00)
LDL CALC: 113 mg/dL — AB (ref 0–99)
NonHDL: 121.94
TRIGLYCERIDES: 45 mg/dL (ref 0.0–149.0)
Total CHOL/HDL Ratio: 4
VLDL: 9 mg/dL (ref 0.0–40.0)

## 2017-07-24 LAB — VITAMIN B12: Vitamin B-12: 439 pg/mL (ref 211–911)

## 2017-07-24 LAB — HEMOGLOBIN A1C: Hgb A1c MFr Bld: 5.1 % (ref 4.6–6.5)

## 2017-07-24 LAB — VITAMIN D 25 HYDROXY (VIT D DEFICIENCY, FRACTURES): VITD: 17.51 ng/mL — ABNORMAL LOW (ref 30.00–100.00)

## 2017-07-24 LAB — TSH: TSH: 1.35 u[IU]/mL (ref 0.35–4.50)

## 2017-07-24 NOTE — Assessment & Plan Note (Signed)
Using patanol eye and nose spray for her symptoms. Talked to them about daily zyrtec to help better and they will consider it.

## 2017-07-24 NOTE — Assessment & Plan Note (Signed)
Has mental health disability and on invega and will continue with psych. Mom helps to provide history but is a poor historian herself. She is able to do ADLs on her own. Unclear to me today if she is able to live independently.

## 2017-07-24 NOTE — Assessment & Plan Note (Signed)
BMI 39 and checking HgA1c and lipid panel today to check for complications.

## 2017-07-24 NOTE — Patient Instructions (Signed)
We are checking the blood work today and will call you back with the results.   

## 2017-07-24 NOTE — Assessment & Plan Note (Signed)
We talked some about fiber intake to help with normal bowel movements as well as regular exercise. She can take otc miralax as needed.

## 2017-07-24 NOTE — Progress Notes (Signed)
   Subjective:    Patient ID: Ashley SpinnerNancy L Lenhoff, female    DOB: 05/27/1991, 26 y.o.   MRN: [id number]  HPI The patient is a new 26 YO female coming in for continuation of her medical care including her allergic rhinitis (seeing allergy specialist, using patanol eye and nose spray with reasonable success, some worsening lately with the weather changes, denies SOB or fevers or chills, some drainage which is clear to cloudy and mild blood, blows her nose a lot), and her mood disorder (seeing psych for this, some IQ change due to this and does not live independently but able to do ADLs for herself, mom helps to provide some history, taking invega liquid). Denies new concerns today. Denies SOB, chest pains, abdominal pain. Some constipation but taking otc meds with relief, passing gas well. No pains. Has gyn and up to date on pap smear and screening.   [facility], social history reviewed and updated.   Review of Systems  Constitutional: Negative.   HENT: Positive for congestion, postnasal drip and rhinorrhea. Negative for ear discharge, ear pain, sinus pressure, sinus pain, sore throat and trouble swallowing.   Eyes: Negative.   Respiratory: Negative for cough, chest tightness and shortness of breath.   Cardiovascular: Negative for chest pain, palpitations and leg swelling.  Gastrointestinal: Negative for abdominal distention, abdominal pain, constipation, diarrhea, nausea and vomiting.  Musculoskeletal: Negative.   Skin: Negative.   Neurological: Negative.   Psychiatric/Behavioral: Positive for decreased concentration.      Objective:   Physical Exam  Constitutional: She is oriented to person, place, and time. She appears well-developed and well-nourished.  Overweight  HENT:  Head: Normocephalic and atraumatic.  Oropharynx with redness and nasal turbinates swollen.  Eyes: EOM are normal.  Neck: Normal range of motion.  Cardiovascular: Normal rate and regular rhythm.  Pulmonary/Chest: Effort  normal and breath sounds normal. No respiratory distress. She has no wheezes. She has no rales.  Abdominal: Soft. Bowel sounds are normal. She exhibits no distension. There is no tenderness. There is no rebound.  Musculoskeletal: She exhibits no edema.  Neurological: She is alert and oriented to person, place, and time. Coordination normal.  Skin: Skin is warm and dry.  Psychiatric: She has a normal mood and affect.  Mom helps with history but mom is wandering historian   Vitals:   07/24/17 0922  BP: 110/88  Pulse: 88  Temp: 98.4 F (36.9 C)  TempSrc: Oral  SpO2: 99%  Weight: 237 lb (107.5 kg)  Height: 5\' 5"  (1.651 m)      Assessment & Plan:

## 2017-07-26 ENCOUNTER — Encounter: Payer: Self-pay | Admitting: Internal Medicine

## 2017-08-29 ENCOUNTER — Encounter: Payer: Self-pay | Admitting: *Deleted

## 2017-09-11 ENCOUNTER — Ambulatory Visit (INDEPENDENT_AMBULATORY_CARE_PROVIDER_SITE_OTHER): Payer: Medicare Other | Admitting: Internal Medicine

## 2017-09-11 ENCOUNTER — Encounter: Payer: Self-pay | Admitting: Internal Medicine

## 2017-09-11 VITALS — BP 112/76 | HR 72 | Ht 64.5 in | Wt 242.2 lb

## 2017-09-11 DIAGNOSIS — K219 Gastro-esophageal reflux disease without esophagitis: Secondary | ICD-10-CM | POA: Diagnosis not present

## 2017-09-11 DIAGNOSIS — K5900 Constipation, unspecified: Secondary | ICD-10-CM

## 2017-09-11 MED ORDER — RANITIDINE HCL 150 MG PO TABS: 150.0000 mg | ORAL_TABLET | Freq: Two times a day (BID) | ORAL | 3 refills | Status: DC

## 2017-09-11 MED ORDER — POLYETHYLENE GLYCOL 3350 17 GM/SCOOP PO POWD: ORAL | 1 refills | Status: DC

## 2017-09-11 NOTE — Patient Instructions (Signed)
Please purchase the following medications over the counter and take as directed: Miralax 8 grams (1/2 capful) daily dissolved into at least 8 ounces water/juice daily  We have sent the following medications to your pharmacy for you to pick up at your convenience: Zantac 150 mg twice daily  Please follow up with Dr Rhea BeltonPyrtle in 4 months.  If you are age 26 or older, your body mass index should be between 23-30. Your Body mass index is 40.94 kg/m. If this is out of the aforementioned range listed, please consider follow up with your Primary Care Provider.  If you are age 26 or younger, your body mass index should be between 19-25. Your Body mass index is 40.94 kg/m. If this is out of the aformentioned range listed, please consider follow up with your Primary Care Provider.

## 2017-09-11 NOTE — Progress Notes (Signed)
Patient ID: Ashley SpinnerNancy L Naef, female   DOB: 01/05/1991, 27 y.o.   MRN: [id number] HPI: Hubbard Hartshornancy Reardon is a 27 year-old female with PMH of bipolar disorder, ADHD and history of seizures who is here today with her mother and seen in consultation at the request of Dr. Okey Duprerawford to discuss constipation and reflux.  The patient states that she will intermittently have sour brash followed by a gagging type sensation.  She does not feel true heartburn but more food and fluid coming back into her mouth after meals.  She denies dysphagia and odynophagia.  No nausea or vomiting.  Good appetite.  No weight loss in fact she has gained weight.  She has not used any over-the-counter medicines for indigestion or reflux.  She also states that she has some lower abdominal bloating as well as stools which are large and at times difficult to pass.  At times stools can be hard and she has feeling of incomplete evacuation.  No blood in her stool or melena.  No diarrhea.  Past Medical History:  Diagnosis Date  . ADHD (attention deficit hyperactivity disorder)   . Mood disorder (HCC)   . Seizures (HCC)    childhood    Past Surgical History:  Procedure Laterality Date  . TONSILLECTOMY AND ADENOIDECTOMY      Outpatient Medications Prior to Visit  Medication Sig Dispense Refill  . INVEGA SUSTENNA 156 MG/ML SUSP   0  . Levonorgestrel-Ethinyl Estradiol (AMETHIA) 0.15-0.03 &0.01 MG tablet Take 1 tablet daily by mouth. 1 Package 4  . mometasone (NASONEX) 50 MCG/ACT nasal spray Use 1-2 sprays in each nostril once daily for stuffy nose or drainage. 17 g 3  . olopatadine (PATANOL) 0.1 % ophthalmic solution Can use one drop in each eye twice a day as needed. 5 mL 5  . Olopatadine HCl (PATANASE) 0.6 % SOLN Place 2 puffs into the nose daily.     No facility-administered medications prior to visit.     Allergies  Allergen Reactions  . Doxycycline Rash    Family History  Problem Relation Age of Onset  . Diabetes Mother    . Asthma Sister   . Stroke Maternal Uncle     Social History   Tobacco Use  . Smoking status: Never Smoker  . Smokeless tobacco: Never Used  Substance Use Topics  . Alcohol use: No  . Drug use: No    ROS: As per history of present illness, otherwise negative  BP 112/76   Pulse 72   Ht 5' 4.5" (1.638 m)   Wt 242 lb 4 oz (109.9 kg)   BMI 40.94 kg/m  Constitutional: Well-developed and well-nourished. No distress. HEENT: Normocephalic and atraumatic. Oropharynx is clear and moist. Conjunctivae are normal.  No scleral icterus. Neck: Neck supple. Trachea midline. Cardiovascular: Normal rate, regular rhythm and intact distal pulses. No M/R/G Pulmonary/chest: Effort normal and breath sounds normal. No wheezing, rales or rhonchi. Abdominal: Soft, obese, nontender, nondistended. Bowel sounds active throughout. There are no masses palpable. No hepatosplenomegaly. Extremities: no clubbing, cyanosis, or edema Neurological: Alert and oriented to person place and time. Skin: Skin is warm and dry.  Psychiatric: Normal mood and affect. Behavior is normal.  RELEVANT LABS AND IMAGING: CBC    Component Value Date/Time   WBC 7.7 07/24/2017 1044   RBC 4.35 07/24/2017 1044   HGB 13.7 07/24/2017 1044   HCT 40.7 07/24/2017 1044   PLT 237.0 07/24/2017 1044   MCV 93.7 07/24/2017 1044   MCHC 33.5  07/24/2017 1044   RDW 13.3 07/24/2017 1044    CMP     Component Value Date/Time   NA 138 07/24/2017 1044   K 3.9 07/24/2017 1044   CL 107 07/24/2017 1044   CO2 25 07/24/2017 1044   GLUCOSE 89 07/24/2017 1044   BUN 13 07/24/2017 1044   CREATININE 0.82 07/24/2017 1044   CALCIUM 9.3 07/24/2017 1044   PROT 7.7 07/24/2017 1044   ALBUMIN 4.2 07/24/2017 1044   AST 27 07/24/2017 1044   ALT 21 07/24/2017 1044   ALKPHOS 51 07/24/2017 1044   BILITOT 0.6 07/24/2017 1044    ASSESSMENT/PLAN: 27 year-old female with PMH of bipolar disorder, ADHD and history of seizures who is here today with her  mother and seen in consultation at the request of Dr. Okey Dupre to discuss constipation and reflux  1.  GERD --patient has symptoms of gagging precipitated by regurgitation.  No true heartburn or indigestion type symptoms.  I recommended she try ranitidine 150 mg twice daily as needed  2.  Mild constipation --she is having some large and at times hard stool.  I recommended she try MiraLAX at half dose daily.  Can titrate to full dose as needed.  Notify me if ineffective  Follow-up in about 4-6 months to ensure improvement, sooner if necessary     HY:QMVHQION, Austin Miles, Md [address] [place], [place] [address]

## 2018-01-23 ENCOUNTER — Encounter: Payer: Self-pay | Admitting: Internal Medicine

## 2018-01-23 ENCOUNTER — Ambulatory Visit (INDEPENDENT_AMBULATORY_CARE_PROVIDER_SITE_OTHER): Payer: Medicare Other | Admitting: Internal Medicine

## 2018-01-23 ENCOUNTER — Other Ambulatory Visit (INDEPENDENT_AMBULATORY_CARE_PROVIDER_SITE_OTHER): Payer: Medicare Other

## 2018-01-23 DIAGNOSIS — Z6839 Body mass index (BMI) 39.0-39.9, adult: Secondary | ICD-10-CM | POA: Diagnosis not present

## 2018-01-23 DIAGNOSIS — E669 Obesity, unspecified: Secondary | ICD-10-CM

## 2018-01-23 DIAGNOSIS — J3089 Other allergic rhinitis: Secondary | ICD-10-CM | POA: Diagnosis not present

## 2018-01-23 LAB — LIPID PANEL
CHOLESTEROL: 184 mg/dL (ref 0–200)
HDL: 36.8 mg/dL — AB (ref >39.00)
LDL Cholesterol: 133 mg/dL — ABNORMAL HIGH (ref 0–99)
NonHDL: 147.18
Total CHOL/HDL Ratio: 5
Triglycerides: 70 mg/dL (ref 0.0–149.0)
VLDL: 14 mg/dL (ref 0.0–40.0)

## 2018-01-23 LAB — VITAMIN D 25 HYDROXY (VIT D DEFICIENCY, FRACTURES): VITD: 21.41 ng/mL — ABNORMAL LOW (ref 30.00–100.00)

## 2018-01-23 LAB — HEMOGLOBIN A1C: HEMOGLOBIN A1C: 5 % (ref 4.6–6.5)

## 2018-01-23 MED ORDER — MOMETASONE FUROATE 50 MCG/ACT NA SUSP: NASAL | 3 refills | Status: DC

## 2018-01-23 MED ORDER — OLOPATADINE HCL 0.6 % NA SOLN: 2.0000 | Freq: Every day | NASAL | 11 refills | Status: DC

## 2018-01-23 NOTE — Progress Notes (Signed)
   Subjective:    Patient ID: Ashley [name], female    DOB: 07/30/1991, 27 y.o.   MRN: [id number]  HPI The patient is a 27 YO female coming in for follow up. Since last visit her guardianship has changed due to some family dynamics. She is happy with how things are now. She denies new problems. Still having allergies and is using her nose and eye drops to help. She denies fevers or chills. She is feeling very well overall. Is exercising some to help her health. She does all of her own ADLs and lives independently and can sign her own documents. Her legal guardian just helps to provide advice and guidance.   Review of Systems  Constitutional: Negative.   HENT: Negative.   Eyes: Negative.   Respiratory: Negative for cough, chest tightness and shortness of breath.   Cardiovascular: Negative for chest pain, palpitations and leg swelling.  Gastrointestinal: Negative for abdominal distention, abdominal pain, constipation, diarrhea, nausea and vomiting.  Musculoskeletal: Negative.   Skin: Negative.   Neurological: Negative.   Psychiatric/Behavioral: Negative.       Objective:   Physical Exam  Constitutional: She is oriented to person, place, and time. She appears well-developed and well-nourished.  HENT:  Head: Normocephalic and atraumatic.  Eyes: EOM are normal.  Neck: Normal range of motion.  Cardiovascular: Normal rate and regular rhythm.  Pulmonary/Chest: Effort normal and breath sounds normal. No respiratory distress. She has no wheezes. She has no rales.  Abdominal: Soft. Bowel sounds are normal. She exhibits no distension. There is no tenderness. There is no rebound.  Musculoskeletal: She exhibits no edema.  Neurological: She is alert and oriented to person, place, and time. Coordination normal.  Skin: Skin is warm and dry.  Psychiatric: She has a normal mood and affect.   Vitals:   01/23/18 1008  BP: 122/80  Pulse: 81  Temp: 98.1 F (36.7 C)  TempSrc: Oral  SpO2: 99%    Weight: 237 lb (107.5 kg)  Height: 5' 4.5" (1.638 m)      Assessment & Plan:

## 2018-01-23 NOTE — Patient Instructions (Signed)
We will check the labs today. 

## 2018-01-24 ENCOUNTER — Telehealth: Payer: Self-pay

## 2018-01-24 NOTE — Telephone Encounter (Signed)
PA started on CoverMyMeds KEY: [id number]

## 2018-01-26 NOTE — Assessment & Plan Note (Signed)
Refill patanol and nasonex for her allergies which is fairly effective at this time. She does not have any complications at this time with her allergies.

## 2018-01-26 NOTE — Assessment & Plan Note (Signed)
Checking labs for complications. She is working on being active and healthy. Her weight is increased some. 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

## 2018-02-07 ENCOUNTER — Ambulatory Visit (INDEPENDENT_AMBULATORY_CARE_PROVIDER_SITE_OTHER): Payer: Medicare Other | Admitting: Certified Nurse Midwife

## 2018-02-07 ENCOUNTER — Encounter: Payer: Self-pay | Admitting: Certified Nurse Midwife

## 2018-02-07 ENCOUNTER — Other Ambulatory Visit (HOSPITAL_COMMUNITY)
Admission: RE | Admit: 2018-02-07 | Discharge: 2018-02-07 | Disposition: A | Discharge status: Home / Self Care | Payer: Medicare Other | Source: Ambulatory Visit | Attending: Certified Nurse Midwife | Admitting: Certified Nurse Midwife

## 2018-02-07 VITALS — BP 118/82 | HR 86 | Ht 65.0 in | Wt 233.0 lb

## 2018-02-07 DIAGNOSIS — Z3041 Encounter for surveillance of contraceptive pills: Secondary | ICD-10-CM

## 2018-02-07 DIAGNOSIS — Z01419 Encounter for gynecological examination (general) (routine) without abnormal findings: Secondary | ICD-10-CM | POA: Diagnosis present

## 2018-02-07 DIAGNOSIS — N898 Other specified noninflammatory disorders of vagina: Secondary | ICD-10-CM

## 2018-02-07 MED ORDER — LEVONORGEST-ETH ESTRAD 91-DAY 0.15-0.03 &0.01 MG PO TABS: 1.0000 | ORAL_TABLET | Freq: Every day | ORAL | 4 refills | Status: DC

## 2018-02-07 NOTE — Addendum Note (Signed)
Addended by: Tim LairLARK, Geovanie Winnett on: 02/07/2018 04:19 PM   Modules accepted: Orders

## 2018-02-07 NOTE — Progress Notes (Signed)
Patient presents for her Annual Exam  Today.   CC: None   LMP: every 3 months last period April  Contraception: BCPs  Needs refill  STD Screening:None  Last pap: 07/05/2016

## 2018-02-07 NOTE — Progress Notes (Signed)
Subjective:        Ashley [name] is a 27 y.o. female here for a routine exam. No complaints, here for annual exam with her aunt and sister (who has custody of her now). Has mental impairments, but can answer questions and follow a conversation. Denies any problems with her menses, on Seasonique. Tolerating well. Denies any problems with vaginal discharge. Not sexually active. Pap smear two years ago was normal.  Not employed, has severe mental issues but is very pleasant and can answer questions appropriately.  Can follow instructions and complete exam without problems.  Tolerates speculum exam.    Personal health questionnaire:  Is patient Ashkenazi Jewish, have a family history of breast and/or ovarian cancer: no Is there a family history of uterine cancer diagnosed at age < 62, gastrointestinal cancer, urinary tract cancer, family member who is a Personnel officer syndrome-associated carrier: no Is the patient overweight and hypertensive, family history of diabetes, personal history of gestational diabetes, preeclampsia or PCOS: yes Is patient over 12, have PCOS,  family history of premature CHD under age 99, diabetes, smoke, have hypertension or peripheral artery disease:  no At any time, has a partner hit, kicked or otherwise hurt or frightened you?: no Over the past 2 weeks, have you felt down, depressed or hopeless?: no Over the past 2 weeks, have you felt little interest or pleasure in doing things?:no   Gynecologic History No LMP recorded. Contraception: OCP (estrogen/progesterone) Last Pap: 07/05/16. Results were: normal Last mammogram: n/a <40 years, no significant family history.   Obstetric History OB History  Gravida Para Term Preterm AB Living  0 0 0 0 0 0  SAB TAB Ectopic Multiple Live Births  0 0 0 0      Past Medical History:  Diagnosis Date  . ADHD (attention deficit hyperactivity disorder)   . Mood disorder (HCC)   . Seizures (HCC)    childhood    Past  Surgical History:  Procedure Laterality Date  . TONSILLECTOMY AND ADENOIDECTOMY       Current Outpatient Medications:  .  INVEGA SUSTENNA 156 MG/ML SUSP, , Disp: , Rfl: 0 .  Levonorgestrel-Ethinyl Estradiol (AMETHIA) 0.15-0.03 &0.01 MG tablet, Take 1 tablet by mouth daily., Disp: 1 Package, Rfl: 4 .  mometasone (NASONEX) 50 MCG/ACT nasal spray, Use 1-2 sprays in each nostril once daily for stuffy nose or drainage., Disp: 17 g, Rfl: 3 .  olopatadine (PATANOL) 0.1 % ophthalmic solution, Can use one drop in each eye twice a day as needed., Disp: 5 mL, Rfl: 5 .  Olopatadine HCl (PATANASE) 0.6 % SOLN, Place 2 sprays into the nose daily., Disp: 30.5 g, Rfl: 11 .  polyethylene glycol powder (GLYCOLAX/MIRALAX) powder, Take 1/2 capful (8 grams) daily dissolved in at least 8 ounces water/juice, Disp: 527 g, Rfl: 1 .  ranitidine (ZANTAC) 150 MG tablet, Take 1 tablet (150 mg total) by mouth 2 (two) times daily., Disp: 60 tablet, Rfl: 3 Allergies  Allergen Reactions  . Doxycycline Rash    Social History   Tobacco Use  . Smoking status: Never Smoker  . Smokeless tobacco: Never Used  Substance Use Topics  . Alcohol use: No    Family History  Problem Relation Age of Onset  . Diabetes Mother   . Asthma Sister   . Stroke Maternal Uncle       Review of Systems  Constitutional: negative for fatigue and weight loss Respiratory: negative for cough and wheezing Cardiovascular: negative for chest pain,  fatigue and palpitations Gastrointestinal: negative for abdominal pain and change in bowel habits Musculoskeletal:negative for myalgias Neurological: negative for gait problems and tremors Behavioral/Psych: negative for abusive relationship, depression Endocrine: negative for temperature intolerance    Genitourinary:negative for abnormal menstrual periods, genital lesions, hot flashes, sexual problems and vaginal discharge Integument/breast: negative for breast lump, breast tenderness, nipple  discharge and skin lesion(s)    Objective:       BP 118/82   Pulse 86   Ht 5\' 5"  (1.651 m)   Wt 233 lb (105.7 kg)   BMI 38.77 kg/m  General:   alert  Skin:   no rash or abnormalities  Lungs:   clear to auscultation bilaterally  Heart:   regular rate and rhythm, S1, S2 normal, no murmur, click, rub or gallop  Breasts:   normal without suspicious masses, skin or nipple changes or axillary nodes  Abdomen:  normal findings: no organomegaly, soft, non-tender and no hernia  Pelvis:  External genitalia: normal general appearance Urinary system: urethral meatus normal and bladder without fullness, nontender Vaginal: normal without tenderness, induration or masses Cervix: normal appearance Adnexa: normal bimanual exam Uterus: anteverted and non-tender, normal size   Lab Review Urine pregnancy test Labs reviewed yes Radiologic studies reviewed no  50% of 30 min visit spent on counseling and coordination of care.   Assessment & Plan    Healthy female exam.    1. Well woman exam    - Cytology - PAP  2. Encounter for surveillance of contraceptive pills    - Levonorgestrel-Ethinyl Estradiol (AMETHIA) 0.15-0.03 &0.01 MG tablet; Take 1 tablet by mouth daily.  Dispense: 1 Package; Refill: 4   Education reviewed: calcium supplements, depression evaluation, low fat, low cholesterol diet, safe sex/STD prevention, self breast exams, skin cancer screening and weight bearing exercise. Contraception: OCP (estrogen/progesterone). Follow up in: 1 year.   Meds ordered this encounter  Medications  . Levonorgestrel-Ethinyl Estradiol (AMETHIA) 0.15-0.03 &0.01 MG tablet    Sig: Take 1 tablet by mouth daily.    Dispense:  1 Package    Refill:  4    Follow up as needed.

## 2018-02-09 LAB — CERVICOVAGINAL ANCILLARY ONLY
Bacterial vaginitis: NEGATIVE
Candida vaginitis: NEGATIVE
Chlamydia: NEGATIVE
Neisseria Gonorrhea: NEGATIVE
Trichomonas: NEGATIVE

## 2018-02-13 LAB — CYTOLOGY - PAP
Diagnosis: NEGATIVE
HPV (WINDOPATH): NOT DETECTED

## 2018-03-18 ENCOUNTER — Emergency Department (HOSPITAL_COMMUNITY)
Admission: EM | Admit: 2018-03-18 | Discharge: 2018-03-18 | Disposition: A | Discharge status: Home / Self Care | Payer: Medicare Other | Attending: Emergency Medicine | Admitting: Emergency Medicine

## 2018-03-18 ENCOUNTER — Encounter (HOSPITAL_COMMUNITY): Payer: Self-pay | Admitting: Emergency Medicine

## 2018-03-18 ENCOUNTER — Other Ambulatory Visit: Payer: Self-pay

## 2018-03-18 DIAGNOSIS — Y999 Unspecified external cause status: Secondary | ICD-10-CM | POA: Insufficient documentation

## 2018-03-18 DIAGNOSIS — Y929 Unspecified place or not applicable: Secondary | ICD-10-CM | POA: Diagnosis not present

## 2018-03-18 DIAGNOSIS — W268XXA Contact with other sharp object(s), not elsewhere classified, initial encounter: Secondary | ICD-10-CM | POA: Insufficient documentation

## 2018-03-18 DIAGNOSIS — S61011A Laceration without foreign body of right thumb without damage to nail, initial encounter: Secondary | ICD-10-CM | POA: Diagnosis not present

## 2018-03-18 DIAGNOSIS — F909 Attention-deficit hyperactivity disorder, unspecified type: Secondary | ICD-10-CM | POA: Insufficient documentation

## 2018-03-18 DIAGNOSIS — Y939 Activity, unspecified: Secondary | ICD-10-CM | POA: Insufficient documentation

## 2018-03-18 DIAGNOSIS — Z23 Encounter for immunization: Secondary | ICD-10-CM | POA: Insufficient documentation

## 2018-03-18 DIAGNOSIS — Z79899 Other long term (current) drug therapy: Secondary | ICD-10-CM | POA: Diagnosis not present

## 2018-03-18 MED ORDER — TETANUS-DIPHTH-ACELL PERTUSSIS 5-2.5-18.5 LF-MCG/0.5 IM SUSP
0.5000 mL | Freq: Once | INTRAMUSCULAR | Status: AC
  Administered 2018-03-18: 0.5 mL via INTRAMUSCULAR
  Filled 2018-03-18: qty 0.5

## 2018-03-18 MED ORDER — SULFAMETHOXAZOLE-TRIMETHOPRIM 800-160 MG PO TABS: 1.0000 | ORAL_TABLET | Freq: Two times a day (BID) | ORAL | 0 refills | Status: AC

## 2018-03-18 MED ORDER — SULFAMETHOXAZOLE-TRIMETHOPRIM 800-160 MG PO TABS: 1.0000 | ORAL_TABLET | Freq: Two times a day (BID) | ORAL | 0 refills | Status: DC

## 2018-03-18 NOTE — Discharge Instructions (Signed)
Return if any problems.

## 2018-03-18 NOTE — ED Triage Notes (Signed)
Patient was taking out trash can yesterday and she sliced her finger with a can. Right thumb laceration about 1in. Legal guardian at bedside at this time. No updated tetanus shot.

## 2018-03-18 NOTE — ED Notes (Signed)
Patient is A&Ox4 at this time.  Patient in no signs of distress.  Please see providers note for complete history and physical exam.  

## 2018-03-18 NOTE — ED Provider Notes (Signed)
[facility] EMERGENCY DEPARTMENT Provider Note   CSN: [id number] Arrival date & time: 03/18/18  1937     History   Chief Complaint Chief Complaint  Patient presents with  . Laceration    HPI Ashley [name] is a 27 y.o. female.  The history is provided by the patient. No language interpreter was used.  Laceration   The incident occurred yesterday. The laceration is located on the right hand. The laceration is 1 cm in size. The laceration mechanism was a a metal edge. The pain is moderate. The pain has been constant since onset. She reports no foreign bodies present. Her tetanus status is out of date.  Pt cut her finger on a metal can lid in the trash.  Family concerned.  Pt needs a tetanus.    Past Medical History:  Diagnosis Date  . ADHD (attention deficit hyperactivity disorder)   . Mood disorder (HCC)   . Seizures (HCC)    childhood    Patient Active Problem List   Diagnosis Date Noted  . Allergic rhinitis 07/24/2017  . Dysmenorrhea 01/25/2013  . Morbid obesity (HCC) 12/21/2011  . Mood disorder 12/21/2011  . Constipation 12/21/2011  . ADD (attention deficit disorder) 02/19/2010    Past Surgical History:  Procedure Laterality Date  . TONSILLECTOMY AND ADENOIDECTOMY       OB History    Gravida  0   Para  0   Term  0   Preterm  0   AB  0   Living  0     SAB  0   TAB  0   Ectopic  0   Multiple  0   Live Births               Home Medications    Prior to Admission medications   Medication Sig Start Date End Date Taking? Authorizing Provider  INVEGA SUSTENNA 156 MG/ML SUSP  10/01/14   [provider]  Levonorgestrel-Ethinyl Estradiol (AMETHIA) 0.15-0.03 &0.01 MG tablet Take 1 tablet by mouth daily. 02/07/18   Denney, Rachelle A, CNM  mometasone (NASONEX) 50 MCG/ACT nasal spray Use 1-2 sprays in each nostril once daily for stuffy nose or drainage. 01/23/18   Myrlene Broker, MD  olopatadine (PATANOL) 0.1 %  ophthalmic solution Can use one drop in each eye twice a day as needed. 09/13/16   Kozlow, Alvira Philips, MD  Olopatadine HCl (PATANASE) 0.6 % SOLN Place 2 sprays into the nose daily. 01/23/18   Myrlene Broker, MD  polyethylene glycol powder (GLYCOLAX/MIRALAX) powder Take 1/2 capful (8 grams) daily dissolved in at least 8 ounces water/juice 09/11/17   Pyrtle, Carie Caddy, MD  ranitidine (ZANTAC) 150 MG tablet Take 1 tablet (150 mg total) by mouth 2 (two) times daily. 09/11/17   Pyrtle, Carie Caddy, MD    Family History Family History  Problem Relation Age of Onset  . Diabetes Mother   . Asthma Sister   . Stroke Maternal Uncle     Social History Social History   Tobacco Use  . Smoking status: Never Smoker  . Smokeless tobacco: Never Used  Substance Use Topics  . Alcohol use: No  . Drug use: No     Allergies   Doxycycline   Review of Systems Review of Systems  All other systems reviewed and are negative.    Physical Exam Updated Vital Signs BP (!) 158/96 (BP Location: Right Arm)   Pulse 79   Temp 98 F (  36.7 C) (Oral)   Resp 16   Ht 5\' 5"  (1.651 m)   Wt 107 kg   LMP 03/04/2018   SpO2 100%   BMI 39.27 kg/m   Physical Exam  Constitutional: She appears well-developed and well-nourished.  HENT:  Head: Normocephalic.  Skin:  Laceration 1cm flap right dorsal thumb,  Dark edges,  nv and ns intact,  No sign of infection from   Psychiatric: She has a normal mood and affect.  Nursing note and vitals reviewed.    ED Treatments / Results  Labs (all labs ordered are listed, but only abnormal results are displayed) Labs Reviewed - No data to display  EKG None  Radiology No results found.  Procedures Procedures (including critical care time)  Medications Ordered in ED Medications - No data to display   Initial Impression / Assessment and Plan / ED Course  I have reviewed the triage vital signs and the nursing notes.  Pertinent labs & imaging results that were available  during my care of the patient were reviewed by me and considered in my medical decision making (see chart for details).     Pt and family counseled on wound care and need for follow up.    Final Clinical Impressions(s) / ED Diagnoses   Final diagnoses:  Laceration of right thumb, foreign body presence unspecified, nail damage status unspecified, initial encounter    ED Discharge Orders         Ordered    sulfamethoxazole-trimethoprim (BACTRIM DS,SEPTRA DS) 800-160 MG tablet  2 times daily     03/18/18 2106        An After Visit Summary was printed and given to the patient.    Osie CheeksSofia, Leslie K, PA-C 03/18/18 2107    Sabas SousBero, Michael M, MD 03/19/18 [phone]

## 2018-05-01 ENCOUNTER — Encounter (HOSPITAL_BASED_OUTPATIENT_CLINIC_OR_DEPARTMENT_OTHER): Payer: Medicare Other | Attending: Internal Medicine

## 2018-05-01 DIAGNOSIS — S61011A Laceration without foreign body of right thumb without damage to nail, initial encounter: Secondary | ICD-10-CM | POA: Diagnosis not present

## 2018-05-01 DIAGNOSIS — W268XXA Contact with other sharp object(s), not elsewhere classified, initial encounter: Secondary | ICD-10-CM | POA: Insufficient documentation

## 2018-05-10 ENCOUNTER — Encounter (HOSPITAL_BASED_OUTPATIENT_CLINIC_OR_DEPARTMENT_OTHER): Payer: Medicare Other | Attending: Internal Medicine

## 2018-05-10 DIAGNOSIS — Z872 Personal history of diseases of the skin and subcutaneous tissue: Secondary | ICD-10-CM | POA: Diagnosis not present

## 2018-05-10 DIAGNOSIS — Z09 Encounter for follow-up examination after completed treatment for conditions other than malignant neoplasm: Secondary | ICD-10-CM | POA: Insufficient documentation

## 2018-07-25 ENCOUNTER — Ambulatory Visit (INDEPENDENT_AMBULATORY_CARE_PROVIDER_SITE_OTHER): Payer: Medicare Other | Admitting: Internal Medicine

## 2018-07-25 ENCOUNTER — Other Ambulatory Visit (INDEPENDENT_AMBULATORY_CARE_PROVIDER_SITE_OTHER): Payer: Medicare Other

## 2018-07-25 ENCOUNTER — Encounter: Payer: Self-pay | Admitting: Internal Medicine

## 2018-07-25 VITALS — BP 116/72 | HR 77 | Temp 97.7°F | Ht 65.0 in | Wt 236.0 lb

## 2018-07-25 DIAGNOSIS — E669 Obesity, unspecified: Secondary | ICD-10-CM

## 2018-07-25 DIAGNOSIS — E559 Vitamin D deficiency, unspecified: Secondary | ICD-10-CM | POA: Diagnosis not present

## 2018-07-25 DIAGNOSIS — J3089 Other allergic rhinitis: Secondary | ICD-10-CM | POA: Diagnosis not present

## 2018-07-25 DIAGNOSIS — F39 Unspecified mood [affective] disorder: Secondary | ICD-10-CM | POA: Diagnosis not present

## 2018-07-25 DIAGNOSIS — Z0000 Encounter for general adult medical examination without abnormal findings: Secondary | ICD-10-CM | POA: Diagnosis not present

## 2018-07-25 LAB — CBC
HCT: 37.4 % (ref 36.0–46.0)
HEMOGLOBIN: 13.1 g/dL (ref 12.0–15.0)
MCHC: 34.9 g/dL (ref 30.0–36.0)
MCV: 91.4 fl (ref 78.0–100.0)
Platelets: 231 10*3/uL (ref 150.0–400.0)
RBC: 4.09 Mil/uL (ref 3.87–5.11)
RDW: 13.3 % (ref 11.5–15.5)
WBC: 8.2 10*3/uL (ref 4.0–10.5)

## 2018-07-25 LAB — COMPREHENSIVE METABOLIC PANEL
ALT: 22 U/L (ref 0–35)
AST: 26 U/L (ref 0–37)
Albumin: 4.2 g/dL (ref 3.5–5.2)
Alkaline Phosphatase: 49 U/L (ref 39–117)
BILIRUBIN TOTAL: 0.7 mg/dL (ref 0.2–1.2)
BUN: 10 mg/dL (ref 6–23)
CALCIUM: 9.5 mg/dL (ref 8.4–10.5)
CHLORIDE: 107 meq/L (ref 96–112)
CO2: 23 meq/L (ref 19–32)
Creatinine, Ser: 0.83 mg/dL (ref 0.40–1.20)
GFR: 106.56 mL/min (ref >60.00)
GLUCOSE: 104 mg/dL — AB (ref 70–99)
POTASSIUM: 3.7 meq/L (ref 3.5–5.1)
Sodium: 138 mEq/L (ref 135–145)
Total Protein: 7.5 g/dL (ref 6.0–8.3)

## 2018-07-25 LAB — LIPID PANEL
CHOL/HDL RATIO: 5
Cholesterol: 177 mg/dL (ref 0–200)
HDL: 37.3 mg/dL — AB (ref >39.00)
LDL CALC: 129 mg/dL — AB (ref 0–99)
NONHDL: 139.6
TRIGLYCERIDES: 52 mg/dL (ref 0.0–149.0)
VLDL: 10.4 mg/dL (ref 0.0–40.0)

## 2018-07-25 LAB — HEMOGLOBIN A1C: HEMOGLOBIN A1C: 4.9 % (ref 4.6–6.5)

## 2018-07-25 LAB — VITAMIN D 25 HYDROXY (VIT D DEFICIENCY, FRACTURES): VITD: 17.92 ng/mL — AB (ref 30.00–100.00)

## 2018-07-25 NOTE — Patient Instructions (Signed)
We will check the blood work today. ° ° °

## 2018-07-25 NOTE — Progress Notes (Signed)
   Subjective:    Patient ID: Ashley SpinnerNancy L Fieldhouse, female    DOB: 10/21/1990, 27 y.o.   MRN: [id number]  HPI Here for medicare wellness and physical, no new complaints. Please see A/P for status and treatment of chronic medical problems.   Diet: heart healthy Physical activity: sedentary Depression/mood screen: negative Hearing: intact to whispered voice Visual acuity: grossly normal, performs annual eye exam  ADLs: capable, some with assist Fall risk: none Home safety: good Cognitive evaluation: intact to orientation, naming, recall and repetition EOL planning: adv directives discussed  I have personally reviewed and have noted 1. The patient's medical and social history - reviewed today no changes 2. Their use of alcohol, tobacco or illicit drugs 3. Their current medications and supplements 4. The patient's functional ability including ADL's, fall risks, home safety risks and hearing or visual impairment. 5. Diet and physical activities 6. Evidence for depression or mood disorders 7. Care team reviewed and updated (available in snapshot)  Review of Systems  Constitutional: Negative.   HENT: Negative.   Eyes: Negative.   Respiratory: Negative for cough, chest tightness and shortness of breath.   Cardiovascular: Negative for chest pain, palpitations and leg swelling.  Gastrointestinal: Negative for abdominal distention, abdominal pain, constipation, diarrhea, nausea and vomiting.  Musculoskeletal: Negative.   Skin: Negative.   Neurological: Negative.   Psychiatric/Behavioral: Negative.       Objective:   Physical Exam Constitutional:      Appearance: She is well-developed.  HENT:     Head: Normocephalic and atraumatic.  Neck:     Musculoskeletal: Normal range of motion.  Cardiovascular:     Rate and Rhythm: Normal rate and regular rhythm.  Pulmonary:     Effort: Pulmonary effort is normal. No respiratory distress.     Breath sounds: Normal breath sounds. No wheezing or  rales.  Abdominal:     General: Bowel sounds are normal. There is no distension.     Palpations: Abdomen is soft.     Tenderness: There is no abdominal tenderness. There is no rebound.  Skin:    General: Skin is warm and dry.  Neurological:     Mental Status: She is alert and oriented to person, place, and time.     Coordination: Coordination normal.    Vitals:   07/25/18 1027  BP: 116/72  Pulse: 77  Temp: 97.7 F (36.5 C)  TempSrc: Oral  SpO2: 98%  Weight: 236 lb (107 kg)  Height: 5\' 5"  (1.651 m)      Assessment & Plan:

## 2018-07-26 DIAGNOSIS — Z0000 Encounter for general adult medical examination without abnormal findings: Secondary | ICD-10-CM | POA: Insufficient documentation

## 2018-07-26 NOTE — Assessment & Plan Note (Signed)
Using nasonex and pataday currently doing well and no symptoms.

## 2018-07-26 NOTE — Assessment & Plan Note (Signed)
Sees psych and stable on invega. Lives with grandma but sister is guardian currently.

## 2018-07-26 NOTE — Assessment & Plan Note (Signed)
Pap smear up to date with gyn. Tetanus up to date. Flu shot up to date. Counseled about exercise and sun safety. Given 10 year screening recommendations.

## 2018-07-26 NOTE — Assessment & Plan Note (Signed)
Checking labs to ensure no complications. We talked about her staying more active to help continue with weight loss and not gaining weight

## 2018-08-18 ENCOUNTER — Ambulatory Visit (INDEPENDENT_AMBULATORY_CARE_PROVIDER_SITE_OTHER): Payer: Medicare Other | Admitting: Internal Medicine

## 2018-08-18 ENCOUNTER — Encounter: Payer: Self-pay | Admitting: Internal Medicine

## 2018-08-18 DIAGNOSIS — J209 Acute bronchitis, unspecified: Secondary | ICD-10-CM | POA: Diagnosis not present

## 2018-08-18 MED ORDER — BENZONATATE 200 MG PO CAPS: 200.0000 mg | ORAL_CAPSULE | Freq: Two times a day (BID) | ORAL | 0 refills | Status: DC | PRN

## 2018-08-18 MED ORDER — AZITHROMYCIN 250 MG PO TABS: ORAL_TABLET | ORAL | 0 refills | Status: DC

## 2018-08-18 NOTE — Assessment & Plan Note (Signed)
Zpac tessalon

## 2018-08-18 NOTE — Progress Notes (Signed)
Subjective:  Ashley [name] ID: Ainsley SpinnerNancy L Ashley [name], female    DOB: 01/07/1991  Age: 28 y.o. MRN: [id number]  CC: No chief complaint on file.   HPI Ashley Ashley [name] presents for cough and ST x a few days. Colored mucus  Outpatient Medications Prior to Visit  Medication Sig Dispense Refill  . INVEGA SUSTENNA 156 MG/ML SUSP   0  . Levonorgestrel-Ethinyl Estradiol (AMETHIA) 0.15-0.03 &0.01 MG tablet Take 1 tablet by mouth daily. 1 Package 4  . mometasone (NASONEX) 50 MCG/ACT nasal spray Use 1-2 sprays in each nostril once daily for stuffy nose or drainage. 17 g 3  . olopatadine (PATANOL) 0.1 % ophthalmic solution Can use one drop in each eye twice a day as needed. 5 mL 5  . Olopatadine HCl (PATANASE) 0.6 % SOLN Place 2 sprays into the nose daily. 30.5 g 11  . polyethylene glycol powder (GLYCOLAX/MIRALAX) powder Take 1/2 capful (8 grams) daily dissolved in at least 8 ounces water/juice 527 g 1  . ranitidine (ZANTAC) 150 MG tablet Take 1 tablet (150 mg total) by mouth 2 (two) times daily. 60 tablet 3   No facility-administered medications prior to visit.     ROS: Review of Systems  Constitutional: Positive for fever. Negative for activity change, appetite change, chills, diaphoresis, fatigue and unexpected weight change.  HENT: Positive for congestion, sinus pressure and sore throat. Negative for ear pain, facial swelling, hearing loss, mouth sores, nosebleeds, postnasal drip, rhinorrhea, sneezing, tinnitus and trouble swallowing.   Eyes: Negative for pain, discharge, redness, itching and visual disturbance.  Respiratory: Positive for cough. Negative for chest tightness, shortness of breath, wheezing and stridor.   Cardiovascular: Negative for chest pain, palpitations and leg swelling.  Gastrointestinal: Negative for abdominal distention, anal bleeding, blood in stool, constipation, diarrhea, nausea and rectal pain.  Genitourinary: Negative for difficulty urinating, dysuria, enuresis and pelvic pain.    Musculoskeletal: Negative for arthralgias, back pain, gait problem, joint swelling, neck pain and neck stiffness.  Skin: Negative.  Negative for rash.  Neurological: Negative for dizziness, tremors, seizures, syncope, speech difficulty, weakness, numbness and headaches.  Hematological: Negative for adenopathy. Does not bruise/bleed easily.  Psychiatric/Behavioral: Negative for behavioral problems, decreased concentration, dysphoric mood, sleep disturbance and suicidal ideas. The Ashley [name] is not nervous/anxious.     Objective:  BP 122/68   Pulse (!) 107   Temp 99.5 F (37.5 C) (Oral)   Ht 5\' 5"  (1.651 m)   Wt 224 lb (101.6 kg)   SpO2 98%   BMI 37.28 kg/m   BP Readings from Last 3 Encounters:  08/18/18 122/68  07/25/18 116/72  03/18/18 (!) 158/96    Wt Readings from Last 3 Encounters:  08/18/18 224 lb (101.6 kg)  07/25/18 236 lb (107 kg)  03/18/18 236 lb (107 kg)    Physical Exam Constitutional:      General: She is not in acute distress.    Appearance: She is well-developed.  HENT:     Head: Normocephalic.     Right Ear: External ear normal.     Left Ear: External ear normal.     Nose: Nose normal.  Eyes:     General:        Right eye: No discharge.        Left eye: No discharge.     Conjunctiva/sclera: Conjunctivae normal.     Pupils: Pupils are equal, round, and reactive to light.  Neck:     Musculoskeletal: Normal range of motion and neck supple.  Thyroid: No thyromegaly.     Vascular: No JVD.     Trachea: No tracheal deviation.  Cardiovascular:     Rate and Rhythm: Normal rate and regular rhythm.     Heart sounds: Normal heart sounds.  Pulmonary:     Effort: No respiratory distress.     Breath sounds: No stridor. No wheezing.  Abdominal:     General: Bowel sounds are normal. There is no distension.     Palpations: Abdomen is soft. There is no mass.     Tenderness: There is no abdominal tenderness. There is no guarding or rebound.  Musculoskeletal:         General: No tenderness.  Lymphadenopathy:     Cervical: No cervical adenopathy.  Skin:    Findings: No erythema or rash.  Neurological:     Cranial Nerves: No cranial nerve deficit.     Motor: No abnormal muscle tone.     Coordination: Coordination normal.     Deep Tendon Reflexes: Reflexes normal.  Psychiatric:        Behavior: Behavior normal.        Thought Content: Thought content normal.        Judgment: Judgment normal.     Lab Results  Component Value Date   WBC 8.2 07/25/2018   HGB 13.1 07/25/2018   HCT 37.4 07/25/2018   PLT 231.0 07/25/2018   GLUCOSE 104 (H) 07/25/2018   CHOL 177 07/25/2018   TRIG 52.0 07/25/2018   HDL 37.30 (L) 07/25/2018   LDLCALC 129 (H) 07/25/2018   ALT 22 07/25/2018   AST 26 07/25/2018   NA 138 07/25/2018   K 3.7 07/25/2018   CL 107 07/25/2018   CREATININE 0.83 07/25/2018   BUN 10 07/25/2018   CO2 23 07/25/2018   TSH 1.35 07/24/2017   HGBA1C 4.9 07/25/2018    No results found.  Assessment & Plan:   There are no diagnoses linked to this encounter.   No orders of the defined types were placed in this encounter.    Follow-up: No follow-ups on file.  Sonda Primes, MD

## 2018-08-18 NOTE — Patient Instructions (Signed)
You can use over-the-counter  "cold" medicines  such as "Tylenol cold" , "Advil cold",  "Mucinex" or" Mucinex D"  for cough and congestion.   Avoid decongestants if you have high blood pressure and use "Afrin" nasal spray for nasal congestion as directed. Use " Delsym" or" Robitussin" cough syrup varietis for cough.  You can use plain "Tylenol" or "Advil" for fever, chills and achyness. Use Halls or Ricola cough drops.   Please, make an appointment if you are not better or if you're worse.  

## 2018-08-20 ENCOUNTER — Telehealth: Payer: Self-pay

## 2018-08-20 NOTE — Telephone Encounter (Signed)
Patient went to Saturday clinic

## 2018-08-20 NOTE — Telephone Encounter (Signed)
Copied from CRM [id number]. Topic: General - Other >> Aug 17, 2018  4:12 PM [name] wrote: Ashley [name] name: Ashley [name], Ashley [name] Relation to pt: sister   Call back number: [phone]  Pharmacy: [facility] [phone] - [address] [place] - [address] AT [facility] [address] & [address] [place] [address] Phone: [phone] Fax: [phone]  Reason for call: Patient experiencing coughing requesting Rx, patient declined appointment at this time. Did inform time is sensitive and advised sister regarding the Saturday clinic if she didn't hear back

## 2018-08-22 ENCOUNTER — Encounter: Payer: Self-pay | Admitting: Internal Medicine

## 2018-08-23 ENCOUNTER — Other Ambulatory Visit: Payer: Self-pay | Admitting: Internal Medicine

## 2018-08-23 MED ORDER — HYDROCODONE-HOMATROPINE 5-1.5 MG/5ML PO SYRP: 5.0000 mL | ORAL_SOLUTION | Freq: Three times a day (TID) | ORAL | 0 refills | Status: DC | PRN

## 2019-01-10 ENCOUNTER — Ambulatory Visit: Payer: Self-pay | Admitting: *Deleted

## 2019-01-10 NOTE — Telephone Encounter (Signed)
Patient states that she was stung by a bee this am on her left breast while standing by a trash can at home. Pt states she has about a dime sized area of redness. No swelling, rash,itching or difficulty breathing. Pt currently rates her pain between 5-6. Pt given home care advice for bee sting. Pt advised to return call to office if swelling becomes huge or if site begins to look infected. Pt verbalized understanding.   Reason for Disposition . Normal local reaction to bee, wasp, or yellow jacket sting  Answer Assessment - Initial Assessment Questions 1. TYPE: "What type of sting was it?" (bee, yellow jacket, etc.)      Bee 2. ONSET: "When did it occur?"      This morning 3. LOCATION: "Where is the sting located?"  "How many stings?"     Left breast 4. SWELLING SIZE: "How big is the swelling?" (inches or centimeters)     NO 5. REDNESS: "Is the area red or pink?" If so, ask "What size is area of redness?" (inches or cm). "When did the redness start?"     Maybe a dime size area of swelling 6. PAIN: "Is there any pain?" If so, ask: "How bad is it?"  (Scale 1-10; or mild, moderate, severe)     5-6 7. ITCHING: "Is there any itching?" If so, ask: "How bad is it?"      no 8. RESPIRATORY DISTRESS: "Describe your breathing."     No 9. PRIOR REACTIONS: "Have you had any severe allergic reactions to stings in the past?" if yes, ask: "What happened?"     No 10. OTHER SYMPTOMS: "Do you have any other symptoms?" (e.g., face or tongue swelling, new rash elsewhere, abdominal pain, vomiting)       No 11. PREGNANCY: "Is there any chance you are pregnant?" "When was your last menstrual period?"       No, LMP-takes birthcontrol pill so April  Protocols used: BEE OR YELLOW JACKET STING-A-AH

## 2019-01-22 DIAGNOSIS — Z79899 Other long term (current) drug therapy: Secondary | ICD-10-CM | POA: Diagnosis not present

## 2019-01-29 ENCOUNTER — Telehealth: Payer: Self-pay | Admitting: Internal Medicine

## 2019-01-29 NOTE — Telephone Encounter (Signed)
Patient states she needs to schedule her 6 month fu with Dr. Sharlet Salina.  States she prefers an in office appt.  Please advise.

## 2019-01-29 NOTE — Telephone Encounter (Signed)
Pt scheduled  

## 2019-01-29 NOTE — Telephone Encounter (Signed)
As long as symptom free in office visit is fine

## 2019-01-30 ENCOUNTER — Encounter: Payer: Self-pay | Admitting: Internal Medicine

## 2019-01-30 ENCOUNTER — Other Ambulatory Visit: Payer: Self-pay

## 2019-01-30 ENCOUNTER — Ambulatory Visit (INDEPENDENT_AMBULATORY_CARE_PROVIDER_SITE_OTHER): Payer: Medicare Other | Admitting: Internal Medicine

## 2019-01-30 VITALS — BP 104/70 | HR 95 | Temp 97.8°F | Ht 65.0 in | Wt 231.0 lb

## 2019-01-30 DIAGNOSIS — J3089 Other allergic rhinitis: Secondary | ICD-10-CM

## 2019-01-30 DIAGNOSIS — K5900 Constipation, unspecified: Secondary | ICD-10-CM

## 2019-01-30 DIAGNOSIS — E669 Obesity, unspecified: Secondary | ICD-10-CM | POA: Diagnosis not present

## 2019-01-30 NOTE — Assessment & Plan Note (Signed)
Taking otc zyrtec which is doing well, rare mucinex if needed.

## 2019-01-30 NOTE — Assessment & Plan Note (Signed)
Weight is down 5 pounds and encouraged her to work on continuing with weight loss. Counseled about physical activity which can be done at home due to covid-19 pandemic.

## 2019-01-30 NOTE — Assessment & Plan Note (Signed)
Uses mirlax prn and this is working well. No problems currently so will continue.

## 2019-01-30 NOTE — Progress Notes (Signed)
   Subjective:   Patient ID: Ashley [name], female    DOB: 09-06-1990, 28 y.o.   MRN: [id number]  HPI The patient is a 28 YO female coming in for follow up of her obesity (weight down 5 pounds, she is working to drink more water and working on [employer], is struggling with physical activity due to covid-19 pandemic) and constipation (using miralax and this is working well, denies blood in stool, denies problems recently with this) and allergies (feeling well from these lately, using oral medication daily, rare mucinex if worse, denies fevers or chills or cough or SOB).   Review of Systems  Constitutional: Negative.   HENT: Negative.   Eyes: Negative.   Respiratory: Negative for cough, chest tightness and shortness of breath.   Cardiovascular: Negative for chest pain, palpitations and leg swelling.  Gastrointestinal: Negative for abdominal distention, abdominal pain, constipation, diarrhea, nausea and vomiting.  Musculoskeletal: Negative.   Skin: Negative.   Neurological: Negative.   Psychiatric/Behavioral: Negative.     Objective:  Physical Exam Constitutional:      Appearance: She is well-developed. She is obese.  HENT:     Head: Normocephalic and atraumatic.  Neck:     Musculoskeletal: Normal range of motion.  Cardiovascular:     Rate and Rhythm: Normal rate and regular rhythm.  Pulmonary:     Effort: Pulmonary effort is normal. No respiratory distress.     Breath sounds: Normal breath sounds. No wheezing or rales.  Abdominal:     General: Bowel sounds are normal. There is no distension.     Palpations: Abdomen is soft.     Tenderness: There is no abdominal tenderness. There is no rebound.  Skin:    General: Skin is warm and dry.  Neurological:     Mental Status: She is alert and oriented to person, place, and time.     Coordination: Coordination normal.     Vitals:   01/30/19 0841  BP: 104/70  Pulse: 95  Temp: 97.8 F (36.6 C)  TempSrc: Oral  SpO2: 98%   Weight: 231 lb (104.8 kg)  Height: 5\' 5"  (1.651 m)    Assessment & Plan:

## 2019-02-21 ENCOUNTER — Ambulatory Visit: Payer: Medicare Other

## 2019-07-26 ENCOUNTER — Encounter: Payer: Self-pay | Admitting: Internal Medicine

## 2019-08-01 ENCOUNTER — Ambulatory Visit (INDEPENDENT_AMBULATORY_CARE_PROVIDER_SITE_OTHER): Payer: Medicare Other | Admitting: Internal Medicine

## 2019-08-01 ENCOUNTER — Encounter: Payer: Self-pay | Admitting: Internal Medicine

## 2019-08-01 ENCOUNTER — Other Ambulatory Visit: Payer: Self-pay

## 2019-08-01 VITALS — BP 122/84 | HR 79 | Temp 98.2°F | Ht 65.0 in | Wt 223.0 lb

## 2019-08-01 DIAGNOSIS — K5900 Constipation, unspecified: Secondary | ICD-10-CM

## 2019-08-01 DIAGNOSIS — Z0000 Encounter for general adult medical examination without abnormal findings: Secondary | ICD-10-CM

## 2019-08-01 DIAGNOSIS — L0291 Cutaneous abscess, unspecified: Secondary | ICD-10-CM | POA: Insufficient documentation

## 2019-08-01 LAB — LIPID PANEL
Cholesterol: 188 mg/dL (ref 0–200)
HDL: 36.8 mg/dL — ABNORMAL LOW (ref >39.00)
LDL Cholesterol: 140 mg/dL — ABNORMAL HIGH (ref 0–99)
NonHDL: 151.66
Total CHOL/HDL Ratio: 5
Triglycerides: 59 mg/dL (ref 0.0–149.0)
VLDL: 11.8 mg/dL (ref 0.0–40.0)

## 2019-08-01 LAB — COMPREHENSIVE METABOLIC PANEL
ALT: 27 U/L (ref 0–35)
AST: 36 U/L (ref 0–37)
Albumin: 4.3 g/dL (ref 3.5–5.2)
Alkaline Phosphatase: 50 U/L (ref 39–117)
BUN: 11 mg/dL (ref 6–23)
CO2: 25 mEq/L (ref 19–32)
Calcium: 9.2 mg/dL (ref 8.4–10.5)
Chloride: 107 mEq/L (ref 96–112)
Creatinine, Ser: 0.82 mg/dL (ref 0.40–1.20)
GFR: 100.89 mL/min (ref >60.00)
Glucose, Bld: 85 mg/dL (ref 70–99)
Potassium: 3.8 mEq/L (ref 3.5–5.1)
Sodium: 139 mEq/L (ref 135–145)
Total Bilirubin: 0.9 mg/dL (ref 0.2–1.2)
Total Protein: 7.1 g/dL (ref 6.0–8.3)

## 2019-08-01 LAB — CBC
HCT: 39.9 % (ref 36.0–46.0)
Hemoglobin: 13.2 g/dL (ref 12.0–15.0)
MCHC: 33.2 g/dL (ref 30.0–36.0)
MCV: 93.8 fl (ref 78.0–100.0)
Platelets: 256 10*3/uL (ref 150.0–400.0)
RBC: 4.25 Mil/uL (ref 3.87–5.11)
RDW: 13.5 % (ref 11.5–15.5)
WBC: 8.7 10*3/uL (ref 4.0–10.5)

## 2019-08-01 LAB — HEMOGLOBIN A1C: Hgb A1c MFr Bld: 4.9 % (ref 4.6–6.5)

## 2019-08-01 MED ORDER — CEPHALEXIN 250 MG/5ML PO SUSR: 500.0000 mg | Freq: Two times a day (BID) | ORAL | 0 refills | Status: DC

## 2019-08-01 NOTE — Assessment & Plan Note (Signed)
Flu shot up to date. Tetanus due 2029. Pap smear due 2022. Counseled about sun safety and mole surveillance. Counseled about the dangers of distracted driving. Given 10 year screening recommendations.

## 2019-08-01 NOTE — Patient Instructions (Addendum)
We have sent in keflex liquid. Take 10 mL twice a day for 5 days.  Switch to a deodorant which DOES NOT have antiperspirant in it.  I would switch to trimming rather than shaving the pubic area.   Health Maintenance, Female Adopting a healthy lifestyle and getting preventive care are important in promoting health and wellness. Ask your health care provider about:  The right schedule for you to have regular tests and exams.  Things you can do on your own to prevent diseases and keep yourself healthy. What should I know about diet, weight, and exercise? Eat a healthy diet   Eat a diet that includes plenty of vegetables, fruits, low-fat dairy products, and lean protein.  Do not eat a lot of foods that are high in solid fats, added sugars, or sodium. Maintain a healthy weight Body mass index (BMI) is used to identify weight problems. It estimates body fat based on height and weight. Your health care provider can help determine your BMI and help you achieve or maintain a healthy weight. Get regular exercise Get regular exercise. This is one of the most important things you can do for your health. Most adults should:  Exercise for at least 150 minutes each week. The exercise should increase your heart rate and make you sweat (moderate-intensity exercise).  Do strengthening exercises at least twice a week. This is in addition to the moderate-intensity exercise.  Spend less time sitting. Even light physical activity can be beneficial. Watch cholesterol and blood lipids Have your blood tested for lipids and cholesterol at 27 years of age, then have this test every 5 years. Have your cholesterol levels checked more often if:  Your lipid or cholesterol levels are high.  You are older than 27 years of age.  You are at high risk for heart disease. What should I know about cancer screening? Depending on your health history and family history, you may need to have cancer screening at various  ages. This may include screening for:  Breast cancer.  Cervical cancer.  Colorectal cancer.  Skin cancer.  Lung cancer. What should I know about heart disease, diabetes, and high blood pressure? Blood pressure and heart disease  High blood pressure causes heart disease and increases the risk of stroke. This is more likely to develop in people who have high blood pressure readings, are of African descent, or are overweight.  Have your blood pressure checked: ? Every 3-5 years if you are 60-63 years of age. ? Every year if you are 83 years old or older. Diabetes Have regular diabetes screenings. This checks your fasting blood sugar level. Have the screening done:  Once every three years after age 75 if you are at a normal weight and have a low risk for diabetes.  More often and at a younger age if you are overweight or have a high risk for diabetes. What should I know about preventing infection? Hepatitis B If you have a higher risk for hepatitis B, you should be screened for this virus. Talk with your health care provider to find out if you are at risk for hepatitis B infection. Hepatitis C Testing is recommended for:  Everyone born from [birth year] through 1964.  Anyone with known risk factors for hepatitis C. Sexually transmitted infections (STIs)  Get screened for STIs, including gonorrhea and chlamydia, if: ? You are sexually active and are younger than 27 years of age. ? You are older than 27 years of age and your health  care provider tells you that you are at risk for this type of infection. ? Your sexual activity has changed since you were last screened, and you are at increased risk for chlamydia or gonorrhea. Ask your health care provider if you are at risk.  Ask your health care provider about whether you are at high risk for HIV. Your health care provider may recommend a prescription medicine to help prevent HIV infection. If you choose to take medicine to prevent HIV, you  should first get tested for HIV. You should then be tested every 3 months for as long as you are taking the medicine. Pregnancy  If you are about to stop having your period (premenopausal) and you may become pregnant, seek counseling before you get pregnant.  Take 400 to 800 micrograms (mcg) of folic acid every day if you become pregnant.  Ask for birth control (contraception) if you want to prevent pregnancy. Osteoporosis and menopause Osteoporosis is a disease in which the bones lose minerals and strength with aging. This can result in bone fractures. If you are 49 years old or older, or if you are at risk for osteoporosis and fractures, ask your health care provider if you should:  Be screened for bone loss.  Take a calcium or vitamin D supplement to lower your risk of fractures.  Be given hormone replacement therapy (HRT) to treat symptoms of menopause. Follow these instructions at home: Lifestyle  Do not use any products that contain nicotine or tobacco, such as cigarettes, e-cigarettes, and chewing tobacco. If you need help quitting, ask your health care provider.  Do not use street drugs.  Do not share needles.  Ask your health care provider for help if you need support or information about quitting drugs. Alcohol use  Do not drink alcohol if: ? Your health care provider tells you not to drink. ? You are pregnant, may be pregnant, or are planning to become pregnant.  If you drink alcohol: ? Limit how much you use to 0-1 drink a day. ? Limit intake if you are breastfeeding.  Be aware of how much alcohol is in your drink. In the [place] one drink equals one 12 oz bottle of beer (355 mL), one 5 oz glass of wine (148 mL), or one 1 oz glass of hard liquor (44 mL). General instructions  Schedule regular health, dental, and eye exams.  Stay current with your vaccines.  Tell your health care provider if: ? You often feel depressed. ? You have ever been abused or do not feel  safe at home. Summary  Adopting a healthy lifestyle and getting preventive care are important in promoting health and wellness.  Follow your health care provider's instructions about healthy diet, exercising, and getting tested or screened for diseases.  Follow your health care provider's instructions on monitoring your cholesterol and blood pressure. This information is not intended to replace advice given to you by your health care provider. Make sure you discuss any questions you have with your health care provider. Document Released: 02/07/2011 Document Revised: 07/18/2018 Document Reviewed: 07/18/2018 Elsevier Patient Education  2020 [employer].

## 2019-08-01 NOTE — Assessment & Plan Note (Signed)
Right axillary and some purulent material expressed during visit. Rx keflex 5 days. Advised to avoid antiperspirant products to help avoid recurrence. Call for worsening redness, pain, swelling.

## 2019-08-01 NOTE — Progress Notes (Signed)
Subjective:   Patient ID: Ashley [name], female    DOB: 12/13/1990, 28 y.o.   MRN: [id number]  HPI  Here for medicare wellness and physical, no new complaints. Please see A/P for status and treatment of chronic medical problems.   Diet: heart healthy Physical activity: walks some Depression/mood screen: negative Hearing: intact to whispered voice Visual acuity: grossly normal, performs annual eye exam  ADLs: capable Fall risk: none Home safety: good Cognitive evaluation: intact to orientation, naming, recall and repetition EOL planning: adv directives discussed    Office Visit from 08/01/2019 in [facility] Total Score  0      I have personally reviewed and have noted 1. The patient's medical and social history - reviewed today no changes 2. Their use of alcohol, tobacco or illicit drugs 3. Their current medications and supplements 4. The patient's functional ability including ADL's, fall risks, home safety risks and hearing or visual impairment. 5. Diet and physical activities 6. Evidence for depression or mood disorders 7. Care team reviewed and updated  Patient Care Team: Myrlene Broker, MD as PCP - General (Internal Medicine) Osborn Coho, MD as Attending Physician (Otolaryngology) Baxter Hire, MD as Attending Physician (Allergy) Antionette Char, MD as Attending Physician (Obstetrics and Gynecology) Past Medical History:  Diagnosis Date  . ADHD (attention deficit hyperactivity disorder)   . Mood disorder (HCC)   . Seizures (HCC)    childhood   Past Surgical History:  Procedure Laterality Date  . TONSILLECTOMY AND ADENOIDECTOMY     Family History  Problem Relation Age of Onset  . Diabetes Mother   . Asthma Sister   . Stroke Maternal Uncle     Review of Systems  Constitutional: Negative.   HENT: Negative.   Eyes: Negative.   Respiratory: Negative for cough, chest tightness and shortness of breath.     Cardiovascular: Negative for chest pain, palpitations and leg swelling.  Gastrointestinal: Negative for abdominal distention, abdominal pain, constipation, diarrhea, nausea and vomiting.  Musculoskeletal: Negative.   Skin: Positive for wound.  Neurological: Negative.   Psychiatric/Behavioral: Negative.     Objective:  Physical Exam Constitutional:      Appearance: She is well-developed. She is obese.  HENT:     Head: Normocephalic and atraumatic.  Cardiovascular:     Rate and Rhythm: Normal rate and regular rhythm.  Pulmonary:     Effort: Pulmonary effort is normal. No respiratory distress.     Breath sounds: Normal breath sounds. No wheezing or rales.  Abdominal:     General: Bowel sounds are normal. There is no distension.     Palpations: Abdomen is soft.     Tenderness: There is no abdominal tenderness. There is no rebound.  Musculoskeletal:     Cervical back: Normal range of motion.     Comments: Abscess right axillary region with some purulent drainage expressible, firm to touch without significant fluctuance after expression  Skin:    General: Skin is warm and dry.  Neurological:     Mental Status: She is alert and oriented to person, place, and time.     Coordination: Coordination normal.     Vitals:   08/01/19 0900  BP: 122/84  Pulse: 79  Temp: 98.2 F (36.8 C)  TempSrc: Oral  SpO2: 98%  Weight: 223 lb (101.2 kg)  Height: 5\' 5"  (1.651 m)   This visit occurred during the SARS-CoV-2 public health emergency.  Safety protocols were in place, including  screening questions prior to the visit, additional usage of staff PPE, and extensive cleaning of exam room while observing appropriate contact time as indicated for disinfecting solutions.   Assessment & Plan:

## 2019-08-01 NOTE — Assessment & Plan Note (Signed)
Stable with miralax.

## 2019-08-05 ENCOUNTER — Other Ambulatory Visit: Payer: Self-pay | Admitting: *Deleted

## 2019-08-05 DIAGNOSIS — Z3041 Encounter for surveillance of contraceptive pills: Secondary | ICD-10-CM

## 2019-08-05 MED ORDER — LEVONORGEST-ETH ESTRAD 91-DAY 0.15-0.03 &0.01 MG PO TABS: 1.0000 | ORAL_TABLET | Freq: Every day | ORAL | 1 refills | Status: DC

## 2019-10-03 ENCOUNTER — Encounter (HOSPITAL_BASED_OUTPATIENT_CLINIC_OR_DEPARTMENT_OTHER): Payer: Medicare Other | Attending: Internal Medicine | Admitting: Internal Medicine

## 2019-10-03 ENCOUNTER — Other Ambulatory Visit: Payer: Self-pay

## 2019-10-03 DIAGNOSIS — S61011A Laceration without foreign body of right thumb without damage to nail, initial encounter: Secondary | ICD-10-CM | POA: Diagnosis not present

## 2019-10-07 NOTE — Progress Notes (Signed)
MIKA, GRIFFITTS ([id number]) Visit Report for 10/03/2019 Abuse/Suicide Risk Screen Details Patient Name: Date of Service: TAZIYAH, IANNUZZI 10/03/2019 9:00 AM Medical Record [id number] Patient Account Number: [id number] Date of Birth/Sex: Treating RN: 12/30/1990 (29 y.o. Marleny Fetter Primary Care Saben Donigan: Pricilla Holm Other Clinician: Referring Sherilyn Windhorst: Treating Caylin Nass/Extender:[name], [name], [name] Abuse/Suicide Risk Screen Items Answer ABUSE RISK SCREEN: Has anyone close to you tried to hurt or harm you recentlyo No Do you feel uncomfortable with anyone in your familyo No Has anyone forced you do things that you didnt want to doo No Electronic Signature(s) Signed: 10/07/2019 5:57:14 PM By: Levan Hurst RN, BSN Entered By: Levan Hurst on 10/03/2019 09:10:10 -------------------------------------------------------------------------------- Activities of Daily Living Details Patient Name: Date of Service: LAVREN, LEWAN 10/03/2019 9:00 AM Medical Record [id number] Patient Account Number: [id number] Date of Birth/Sex: Treating RN: 04/04/1991 (29 y.o. Wisdom Fetter Primary Care Nilani Hugill: Pricilla Holm Other Clinician: Referring Noella Kipnis: Treating Royale Swamy/Extender:[name], [name], [name] Activities of Daily Living Items Answer Activities of Daily Living (Please select one for each item) Drive Automobile Not Able Take Medications Completely Able Use Telephone Completely Able Care for Appearance Completely Able Use Toilet Completely Able Bath / Shower Completely Able Dress Self Completely Able Feed Self Completely Able Walk Completely Able Get In / Out Bed Completely Able Housework Completely Able Prepare Meals Completely [place] Need Assistance Shop for Self Need Assistance Electronic Signature(s) Signed: 10/07/2019 5:57:14 PM By: Levan Hurst RN,  BSN Entered By: Levan Hurst on 10/03/2019 09:10:34 -------------------------------------------------------------------------------- Education Screening Details Patient Name: Date of Service: [name] 10/03/2019 9:00 AM Medical Record [id number] Patient Account Number: [id number] Date of Birth/Sex: Treating RN: [birth year] (28 y.o. Loleta Fetter Primary Care Rissa Turley: Pricilla Holm Other Clinician: Referring Niaya Hickok: Treating Floella Ensz/Extender:[name], [name], [name] Primary Learner Assessed: Patient Learning Preferences/Education Level/Primary Language Learning Preference: Explanation, Demonstration, Printed Material Highest Education Level: High School Preferred Language: English Cognitive Barrier Language Barrier: No Translator Needed: No Memory Deficit: No Emotional Barrier: No Cultural/Religious Beliefs Affecting Medical Care: No Physical Barrier Impaired Vision: No Impaired Hearing: No Decreased Hand dexterity: No Knowledge/Comprehension Knowledge Level: High Comprehension Level: High Ability to understand written High instructions: Ability to understand verbal High instructions: Motivation Anxiety Level: Calm Cooperation: Cooperative Education Importance: Acknowledges Need Interest in Health Problems: Asks Questions Perception: Coherent Willingness to Engage in Self- High Management Activities: Readiness to Engage in Self- High Management Activities: Electronic Signature(s) Signed: 10/07/2019 5:57:14 PM By: Levan Hurst RN, BSN Entered By: Levan Hurst on 10/03/2019 09:10:58 -------------------------------------------------------------------------------- Fall Risk Assessment Details Patient Name: Date of Service: [name] 10/03/2019 9:00 AM Medical Record [id number] Patient Account Number: [id number] Date of Birth/Sex: Treating RN: 06-20-1991 (28 y.o. Annora Fetter Primary  Care Ellisa Devivo: Pricilla Holm Other Clinician: Referring Lebert Lovern: Treating Antoni Stefan/Extender:[name], [name], [name] Fall Risk Assessment Items Have you had 2 or more falls in the last 12 monthso 0 No Have you had any fall that resulted in injury in the last 12 monthso 0 No FALLS RISK SCREEN History of falling - immediate or within 3 months 0 No Secondary diagnosis (Do you have 2 or more medical diagnoseso) 0 No Ambulatory aid None/bed rest/wheelchair/nurse 0 Yes Crutches/cane/walker 0 No Furniture 0 No Intravenous therapy Access/Saline/Heparin Lock 0 No Weak (short steps with or without shuffle, stooped but able to lift head 0 No while walking, may seek support from furniture) Impaired (short  steps with shuffle, may have difficulty arising from chair, 0 No head down, impaired balance) Mental Status Oriented to own ability 0 Yes Overestimates or forgets limitations 0 No Risk Level: Low Risk Score: 0 Electronic Signature(s) Signed: 10/07/2019 5:57:14 PM By: Zandra Abts RN, BSN Entered By: Zandra Abts on 10/03/2019 09:11:05 -------------------------------------------------------------------------------- Nutrition Risk Screening Details Patient Name: Date of Service: ANALEYA, LUALLEN 10/03/2019 9:00 AM Medical Record [id number] Patient Account Number: [id number] Date of Birth/Sex: Treating RN: 1991/05/29 (29 y.o. Wynelle Link Primary Care Tiera Mensinger: [name] Other Clinician: Referring Hadli Vandemark: Treating Adaleen Hulgan/Extender:[name], Lewis Moccasin, Rhys Martini in Treatment: 0 Height (in): 65 Weight (lbs): 225 Body Mass Index (BMI): 37.4 Nutrition Risk Screening Items Score Screening NUTRITION RISK SCREEN: I have an illness or condition that made me change the kind and/or 0 No amount of food I eat I eat fewer than two meals per day 0 No I eat few fruits and vegetables, or milk products 0 No I have three or  more drinks of beer, liquor or wine almost every day 0 No I have tooth or mouth problems that make it hard for me to eat 0 No I don't always have enough money to buy the food I need 0 No I eat alone most of the time 0 No I take three or more different prescribed or over-the-counter drugs a day 1 Yes 0 No Without wanting to, I have lost or gained 10 pounds in the last six months I am not always physically able to shop, cook and/or feed myself 0 No Nutrition Protocols Good Risk Protocol 0 No interventions needed Moderate Risk Protocol High Risk Proctocol Risk Level: Good Risk Score: 1 Electronic Signature(s) Signed: 10/07/2019 5:57:14 PM By: Zandra Abts RN, BSN Entered By: Zandra Abts on 10/03/2019 09:11:13

## 2019-10-07 NOTE — Progress Notes (Signed)
MAO, LOCKNER ([id number]) Visit Report for 10/03/2019 Allergy List Details Patient Name: Date of Service: Ashley [name], Ashley [name] 10/03/2019 9:00 AM Medical Record [id number] Patient Account Number: [id number] Date of Birth/Sex: Treating RN: May 15, 1991 (29 y.o. Ashley [name] Primary Care [name]: Pricilla Holm Other Clinician: Referring [name]: Treating Ashley [name]/Extender:[name], [name], [name] Allergies Active Allergies doxycycline Reaction: rash Severity: Moderate Allergy Notes Electronic Signature(s) Signed: 10/07/2019 5:57:14 PM By: Levan Hurst RN, BSN Entered By: Levan Hurst on 10/03/2019 09:06:37 -------------------------------------------------------------------------------- Arrival Information Details Patient Name: Date of Service: Ashley [name] 10/03/2019 9:00 AM Medical Record [id number] Patient Account Number: [id number] Date of Birth/Sex: Treating RN: 1990-11-23 (28 y.o. Andria [name] Primary Care [name]: Pricilla Holm Other Clinician: Referring [name]: Treating Jeriko Kowalke/Extender:[name], [name], [name] Visit Information Patient Arrived: Ambulatory Arrival Time: 09:03 Accompanied By: sister Transfer Assistance: None Patient Identification Verified: Yes Secondary Verification Process Yes Completed: Patient Requires Transmission-Based No Precautions: Patient Has Alerts: No History Since Last Visit Added or deleted any medications: No Any new allergies or adverse reactions: No Had a fall or experienced change in activities of daily living that may affect risk of falls: No Signs or symptoms of abuse/neglect since last visito No Hospitalized since last visit: No Implantable device outside of the clinic excluding cellular tissue based products placed in the center since last visit: No Electronic Signature(s) Signed: 10/07/2019 5:57:14 PM By: Levan Hurst RN, BSN Entered By: Levan Hurst on 10/03/2019 09:16:38 -------------------------------------------------------------------------------- Clinic Level of Care Assessment Details Patient Name: Date of Service: DAGNY, FIORENTINO 10/03/2019 9:00 AM Medical Record [id number] Patient Account Number: [id number] Date of Birth/Sex: Treating RN: [birth year] (29 y.o. [name], [name] Primary Care [name]: Pricilla Holm Other Clinician: Referring Donnia Poplaski: Treating [name]/Extender:[name], [name], [name] Clinic Level of Care Assessment Items TOOL 4 Quantity Score X - Use when only an EandM is performed on FOLLOW-UP visit 1 0 ASSESSMENTS - Nursing Assessment / Reassessment X - Reassessment of Co-morbidities (includes updates in patient status) 1 10 X - Reassessment of Adherence to Treatment Plan 1 5 ASSESSMENTS - Wound and Skin Assessment / Reassessment []  - Simple Wound Assessment / Reassessment - one wound 0 []  - Complex Wound Assessment / Reassessment - multiple wounds 0 X - Dermatologic / Skin Assessment (not related to wound area) 1 10 ASSESSMENTS - Focused Assessment []  - Circumferential Edema Measurements - multi extremities 0 []  - Nutritional Assessment / Counseling / Intervention 0 []  - Lower Extremity Assessment (monofilament, tuning fork, pulses) 0 []  - Peripheral Arterial Disease Assessment (using hand held doppler) 0 ASSESSMENTS - Ostomy and/or Continence Assessment and Care []  - Incontinence Assessment and Management 0 []  - Ostomy Care Assessment and Management (repouching, etc.) 0 PROCESS - Coordination of Care []  - Simple Patient / Family Education for ongoing care 0 []  - Complex (extensive) Patient / Family Education for ongoing care 0 []  - Staff obtains Programmer, systems, Records, Test Results / Process Orders 0 []  - Staff telephones HHA, Nursing Homes / Clarify orders / etc 0 []  - Routine Transfer to another Facility  (non-emergent condition) 0 []  - Routine Hospital Admission (non-emergent condition) 0 []  - New Admissions / Biomedical engineer / Ordering NPWT, Apligraf, etc. 0 []  - Emergency Hospital Admission (emergent condition) 0 X - Simple Discharge Coordination 1 10 []  - Complex (extensive) Discharge Coordination 0 PROCESS - Special Needs []  - Pediatric / Minor Patient Management 0 []  - Isolation Patient Management 0 []  -  Hearing / Language / Visual special needs 0 []  - Assessment of Community assistance (transportation, D/C planning, etc.) 0 []  - Additional assistance / Altered mentation 0 []  - Support Surface(s) Assessment (bed, cushion, seat, etc.) 0 INTERVENTIONS - Wound Cleansing / Measurement []  - Simple Wound Cleansing - one wound 0 []  - Complex Wound Cleansing - multiple wounds 0 []  - Wound Imaging (photographs - any number of wounds) 0 []  - Wound Tracing (instead of photographs) 0 []  - Simple Wound Measurement - one wound 0 []  - Complex Wound Measurement - multiple wounds 0 INTERVENTIONS - Wound Dressings []  - Small Wound Dressing one or multiple wounds 0 []  - Medium Wound Dressing one or multiple wounds 0 []  - Large Wound Dressing one or multiple wounds 0 X - Application of Medications - topical 1 5 []  - Application of Medications - injection 0 INTERVENTIONS - Miscellaneous []  - External ear exam 0 []  - Specimen Collection (cultures, biopsies, blood, body fluids, etc.) 0 []  - Specimen(s) / Culture(s) sent or taken to Lab for analysis 0 []  - Patient Transfer (multiple staff / / Similar devices) 0 []  - Simple Staple / Suture removal (25 or less) 0 []  - Complex Staple / Suture removal (26 or more) 0 []  - Hypo / Hyperglycemic Management (close monitor of Blood Glucose) 0 []  - Ankle / Brachial Index (ABI) - do not check if billed separately 0 X - Vital Signs 1 5 Has the patient been seen at the hospital within the last three years: Yes Total Score: 45 Level Of Care:  New/Established - Level 2 Electronic Signature(s) Signed: 10/03/2019 6:07:18 PM By: Entered By: on 10/03/2019 11:09:52 -------------------------------------------------------------------------------- Multi-Disciplinary Care Plan Details Patient Name: Date of Service: 10/03/2019 9:00 AM Medical Record Patient Account Number: Date of Birth/Sex: Treating RN: 17-May-1991 (29 y.o. Primary Care Anelis Hrivnak: Other Clinician: Referring Dione Mccombie: Treating Forbes Loll/Extender:[name], , in Treatment: 0 Active Inactive Electronic Signature(s) Signed: 10/03/2019 6:07:18 PM By: Entered By: Nurse, adult on 10/03/2019 09:45:04 -------------------------------------------------------------------------------- Pain Assessment Details Patient Name: Date of Service: ERINN, MENDOSA 10/03/2019 9:00 AM Medical Record Patient Account Number: 10/05/2019 Date of Birth/Sex: Treating RN: [birth year] (29 y.o. 10/05/2019 Primary Care Glady Ouderkirk: Ainsley Spinner Other Clinician: Referring Kartik Fernando: Treating Devanny Palecek/Extender:[name], 10/05/2019, [id number] in Treatment: 0 Active Problems Location of Pain Severity and Description of Pain Patient Has Paino No Site Locations Pain Management and Medication Current Pain Management: Electronic Signature(s) Signed: 10/07/2019 5:57:14 PM By: 05/11/1991 RN, BSN Entered By: 34 on 10/03/2019 09:11:26 -------------------------------------------------------------------------------- Patient/Caregiver Education Details Patient Name: Date of Service: Riling, Janay L. 2/25/2021andnbsp9:00 AM Medical Record Lewis Moccasin Patient Account Number: Rhys Martini Date of Birth/Gender: Treating RN: 09/01/1990 (28 y.o. Shawn Stall Primary Care Physician: 10/05/2019 Other Clinician: Referring Physician: Treating Physician/Extender:[name], Ainsley Spinner, 10/05/2019 in Treatment: 0 Education Assessment Education Provided To: Patient Education Topics Provided [facility]: Handouts: [facility] Methods: Explain/Verbal, Printed Responses: Reinforcements needed Electronic Signature(s) Signed: 10/03/2019 6:07:18 PM By: [id number] Entered By: 05/11/1991 on 10/03/2019 09:00:40 -------------------------------------------------------------------------------- Vitals Details Patient Name: Date of Service: Wynelle Link 10/03/2019 9:00 AM Medical Record Lewis Moccasin Patient Account Number: Rhys Martini Date of Birth/Sex: Treating RN: [birth year] (28 y.o. Zandra Abts Primary Care Jaamal Farooqui: 10/05/2019 Other Clinician: Referring Jacobb Alen: Treating Lakendrick Paradis/Extender:[name], 10/05/2019, [id number] in Treatment: 0 Vital Signs  Time Taken: 09:05 Temperature (F): 98.7 Height (in): 65 Pulse (bpm): 72 Source: Stated Respiratory Rate (breaths/min): 16 Weight (lbs): 225 Blood Pressure (mmHg): 111/66 Source: Stated Reference Range: 80 - 120 mg / dl Body Mass Index (BMI): 37.4 Electronic Signature(s) Signed: 10/07/2019 5:57:14 PM By: Zandra Abts RN, BSN Entered By: Zandra Abts on 10/03/2019 09:06:05

## 2019-10-07 NOTE — Progress Notes (Signed)
Ashley [name] ([id number]) Visit Report for 10/03/2019 Chief Complaint Document Details Patient Name: Date of Service: Ashley [name], Ashley [name] 10/03/2019 9:00 AM Medical Record [id number] Patient Account Number: [id number] Date of Birth/Sex: Treating RN: December 03, 1990 (29 y.o. Ashley [name], Ashley [name] Primary Care Provider: [name] Other Clinician: Referring Provider: Treating Provider/Extender:Amarii Amy, Lewis Moccasin, Rhys Martini in Treatment: 0 Information Obtained from: Patient Chief Complaint 05/01/2018; patient is here for review of the wound on the right dorsal thumb 10/03/2019 patient is here for a wound injury to her right index finger Electronic Signature(s) Signed: 10/03/2019 5:41:09 PM By: Baltazar Najjar MD Entered By: Baltazar Najjar on 10/03/2019 17:33:29 -------------------------------------------------------------------------------- HPI Details Patient Name: Date of Service: Ashley [name] 10/03/2019 9:00 AM Medical Record [id number] Patient Account Number: [id number] Date of Birth/Sex: Treating RN: 01-10-1991 (28 y.o. Ashley [name] Primary Care Provider: [name] Other Clinician: Referring Provider: Treating Provider/Extender:Kalandra Masters, Lewis Moccasin, Rhys Martini in Treatment: 0 History of Present Illness HPI Description: ADMISSION 05/01/2018 This is a 29 year old woman who lacerated her right thumb when she reached into a garbage can apparently on some metal. Her aunt who is present states that the wound looks like a 1 cm flap and that is what really was described in the ER note. She did not go right to the ER only went the next day therefore this was not sutured. Her tetanus booster was updated. Apparently this mostly closed over with just the gauze dressing however she has been left with a sizable skin tag over the bed of the wound she is here for our review of this. The patient is not a diabetic, she has gastroesophageal reflux  disease ADHD, seizure disorder and apparently bipolar disorder. 05/10/2018; this is a patient who lacerated her thumb, just below the interphalangeal joint. It required debridement including a large skin tag over most of the wound surface. We use Prisma READMISSION 10/03/2019 This is a now 29 year old woman who we had for 2 visits in this clinic in 2019 at that point she had traumatized her right thumb on a garbage can. She returns to clinic with a history of a small recent injury to her right plantar index finger on again a garbage can. She was concerned that this was not healing and she came in today. Electronic Signature(s) Signed: 10/03/2019 5:41:09 PM By: Baltazar Najjar MD Entered By: Baltazar Najjar on 10/03/2019 17:35:14 -------------------------------------------------------------------------------- Physical Exam Details Patient Name: Date of Service: Ashley [name], Ashley [name] 10/03/2019 9:00 AM Medical Record [id number] Patient Account Number: [id number] Date of Birth/Sex: Treating RN: [birth year] (28 y.o. Ashley [name] Primary Care Provider: [name] Other Clinician: Referring Provider: Treating Provider/Extender:Ashley [name], Lewis Moccasin, Rhys Martini in Treatment: 0 Constitutional Sitting or standing Blood Pressure is within target range for patient.. Pulse regular and within target range for patient.Marland Kitchen Respirations regular, non-labored and within target range.. Temperature is normal and within the target range for the patient.. Notes Wound exam; the area in question is on the plantar right index finger. This was at 1 time a blister with a sanguinous content. This is almost gone down to no blister at all. I did not think this really needed to be addressed with debridement. There is no evidence of surrounding infection. Electronic Signature(s) Signed: 10/03/2019 5:41:09 PM By: Baltazar Najjar MD Entered By: Baltazar Najjar on 10/03/2019  17:36:00 -------------------------------------------------------------------------------- Physician Orders Details Patient Name: Date of Service: REN, Ashley [name] 10/03/2019 9:00 AM Medical Record [id number] Patient Account Number: [id number] Date of Birth/Sex: Treating RN: [birth year] (28 y.o. F) [place], [place]  Primary Care Provider: Pricilla [name] Other Clinician: Referring Provider: Treating Provider/Extender:Harmani Neto, [name], [name] Verbal / Phone Orders: No Diagnosis Coding Discharge From [facility] Services Discharge from [place] - call if any future wound care needs. Primary Wound Dressing Other: - polysporin and a bandaid over right hand finger daily for the next 2 weeks. Electronic Signature(s) Signed: 10/03/2019 5:41:09 PM By: Linton Ham MD Signed: 10/03/2019 6:07:18 PM By: Deon Pilling Entered By: Deon Pilling on 10/03/2019 09:46:21 -------------------------------------------------------------------------------- Problem List Details Patient Name: Date of Service: Ashley [name], Ashley [name] 10/03/2019 9:00 AM Medical Record [id number] Patient Account Number: [id number] Date of Birth/Sex: Treating RN: Oct 13, 1990 (28 y.o. Ashley [name], Ashley [name] Primary Care Provider: Pricilla [name] Other Clinician: Referring Provider: Treating Provider/Extender:[name], [name], [name] Active Problems ICD-10 Evaluated Encounter Code Description Active Date Today Diagnosis S60.410A Abrasion of right index finger, initial encounter 10/03/2019 No Yes Inactive Problems Resolved Problems Electronic Signature(s) Signed: 10/03/2019 5:41:09 PM By: Linton Ham MD Entered By: Linton Ham on 10/03/2019 17:34:05 -------------------------------------------------------------------------------- Progress Note Details Patient Name: Date of Service: Ashley [name] 10/03/2019 9:00 AM Medical Record  [id number] Patient Account Number: [id number] Date of Birth/Sex: Treating RN: [birth year] (28 y.o. Ashley [name] Primary Care Provider: Pricilla [name] Other Clinician: Referring Provider: Treating Provider/Extender:Horace Wishon, [name], [name] Subjective Chief Complaint Information obtained from Patient 05/01/2018; patient is here for review of the wound on the right dorsal thumb 10/03/2019 patient is here for a wound injury to her right index finger History of Present Illness (HPI) ADMISSION 05/01/2018 This is a 29 year old woman who lacerated her right thumb when she reached into a garbage can apparently on some metal. Her aunt who is present states that the wound looks like a 1 cm flap and that is what really was described in the ER note. She did not go right to the ER only went the next day therefore this was not sutured. Her tetanus booster was updated. Apparently this mostly closed over with just the gauze dressing however she has been left with a sizable skin tag over the bed of the wound she is here for our review of this. The patient is not a diabetic, she has gastroesophageal reflux disease ADHD, seizure disorder and apparently bipolar disorder. 05/10/2018; this is a patient who lacerated her thumb, just below the interphalangeal joint. It required debridement including a large skin tag over most of the wound surface. We use Prisma READMISSION 10/03/2019 This is a now 29 year old woman who we had for 2 visits in this clinic in 2019 at that point she had traumatized her right thumb on a garbage can. She returns to clinic with a history of a small recent injury to her right plantar index finger on again a garbage can. She was concerned that this was not healing and she came in today. Patient History Information obtained from Patient. Allergies doxycycline (Severity: Moderate, Reaction: rash) Family History Diabetes - Maternal Grandparents,  Hypertension - Maternal Grandparents,Paternal Grandparents, No family history of Cancer, Heart Disease, Hereditary Spherocytosis, Kidney Disease, Lung Disease, Seizures, Stroke, Thyroid Problems, Tuberculosis. Social History Never smoker, Marital Status - Single, Alcohol Use - Never, Drug Use - No History, Caffeine Use - Moderate. Medical History Eyes Denies history of Cataracts, Glaucoma, Optic Neuritis Ear/Nose/Mouth/Throat Denies history of Chronic sinus problems/congestion, Middle ear problems Hematologic/Lymphatic Denies history of Anemia, Hemophilia, Human Immunodeficiency Virus, Lymphedema, Sickle Cell Disease Respiratory Denies history of Aspiration, Asthma, Chronic Obstructive Pulmonary Disease (COPD),  Pneumothorax, Sleep Apnea, Tuberculosis Cardiovascular Denies history of Angina, Arrhythmia, Congestive Heart Failure, Coronary Artery Disease, Deep Vein Thrombosis, Hypertension, Hypotension, Myocardial Infarction, Peripheral Arterial Disease, Peripheral Venous Disease, Phlebitis, Vasculitis Gastrointestinal Denies history of Cirrhosis , Colitis, Crohnoos, Hepatitis A, Hepatitis B, Hepatitis C Endocrine Denies history of Type I Diabetes, Type II Diabetes Genitourinary Denies history of End Stage Renal Disease Immunological Denies history of Lupus Erythematosus, Raynaudoos, Scleroderma Integumentary (Skin) Denies history of History of Burn Musculoskeletal Denies history of Gout, Rheumatoid Arthritis, Osteoarthritis, Osteomyelitis Neurologic Denies history of Dementia, Neuropathy, Quadriplegia, Paraplegia, Seizure Disorder Oncologic Denies history of Received Chemotherapy, Received Radiation Psychiatric Denies history of Anorexia/bulimia, Confinement Anxiety Medical And Surgical History Notes Psychiatric Mood disorder, ADHD Review of Systems (ROS) Constitutional Symptoms (General Health) Denies complaints or symptoms of Fatigue, Fever, Chills, Marked Weight  Change. Ear/Nose/Mouth/Throat Denies complaints or symptoms of Chronic sinus problems or rhinitis. Respiratory Denies complaints or symptoms of Chronic or frequent coughs, Shortness of Breath. Cardiovascular Denies complaints or symptoms of Chest pain. Gastrointestinal Denies complaints or symptoms of Frequent diarrhea, Nausea, Vomiting. Endocrine Denies complaints or symptoms of Heat/cold intolerance. Genitourinary Denies complaints or symptoms of Frequent urination. Integumentary (Skin) Complains or has symptoms of Wounds - blood blister on right index finger. Musculoskeletal Denies complaints or symptoms of Muscle Pain, Muscle Weakness. Neurologic Denies complaints or symptoms of Numbness/parasthesias. Objective Constitutional Sitting or standing Blood Pressure is within target range for patient.. Pulse regular and within target range for patient.Marland Kitchen Respirations regular, non-labored and within target range.. Temperature is normal and within the target range for the patient.. Vitals Time Taken: 9:05 AM, Height: 65 in, Source: Stated, Weight: 225 lbs, Source: Stated, BMI: 37.4, Temperature: 98.7 F, Pulse: 72 bpm, Respiratory Rate: 16 breaths/min, Blood Pressure: 111/66 mmHg. General Notes: Wound exam; the area in question is on the plantar right index finger. This was at 1 time a blister with a sanguinous content. This is almost gone down to no blister at all. I did not think this really needed to be addressed with debridement. There is no evidence of surrounding infection. Assessment Active Problems ICD-10 Abrasion of right index finger, initial encounter Plan Discharge From [facility] Services: [facility]charge from [facility] - call if any future wound care needs. Primary Wound Dressing: Other: - polysporin and a bandaid over right hand finger daily for the next 2 weeks. 1. I suggested Polysporin and a Band-Aid over this. 2. This did not require debridement. The area will  probably come off on its own in a week or so. Electronic Signature(s) Signed: 10/03/2019 5:41:09 PM By: Baltazar Najjar MD Entered By: Baltazar Najjar on 10/03/2019 17:36:45 -------------------------------------------------------------------------------- HxROS Details Patient Name: Date of Service: Ashley [name], Ashley [name] 10/03/2019 9:00 AM Medical Record [id number] Patient Account Number: [id number] Date of Birth/Sex: Treating RN: 1990/09/29 (28 y.o. Wynelle Link Primary Care Provider: [name] Other Clinician: Referring Provider: Treating Provider/Extender:Leondra Cullin, Lewis Moccasin, Rhys Martini in Treatment: 0 Information Obtained From Patient Constitutional Symptoms (General Health) Complaints and Symptoms: Negative for: Fatigue; Fever; Chills; Marked Weight Change Ear/Nose/Mouth/Throat Complaints and Symptoms: Negative for: Chronic sinus problems or rhinitis Medical History: Negative for: Chronic sinus problems/congestion; Middle ear problems Respiratory Complaints and Symptoms: Negative for: Chronic or frequent coughs; Shortness of Breath Medical History: Negative for: Aspiration; Asthma; Chronic Obstructive Pulmonary Disease (COPD); Pneumothorax; Sleep Apnea; Tuberculosis Cardiovascular Complaints and Symptoms: Negative for: Chest pain Medical History: Negative for: Angina; Arrhythmia; Congestive Heart Failure; Coronary Artery Disease; Deep Vein Thrombosis; Hypertension; Hypotension; Myocardial Infarction; Peripheral Arterial Disease;  Peripheral Venous Disease; Phlebitis; Vasculitis Gastrointestinal Complaints and Symptoms: Negative for: Frequent diarrhea; Nausea; Vomiting Medical History: Negative for: Cirrhosis ; Colitis; Crohns; Hepatitis A; Hepatitis B; Hepatitis C Endocrine Complaints and Symptoms: Negative for: Heat/cold intolerance Medical History: Negative for: Type I Diabetes; Type II Diabetes Genitourinary Complaints and  Symptoms: Negative for: Frequent urination Medical History: Negative for: End Stage Renal Disease Integumentary (Skin) Complaints and Symptoms: Positive for: Wounds - blood blister on right index finger Medical History: Negative for: History of Burn Musculoskeletal Complaints and Symptoms: Negative for: Muscle Pain; Muscle Weakness Medical History: Negative for: Gout; Rheumatoid Arthritis; Osteoarthritis; Osteomyelitis Neurologic Complaints and Symptoms: Negative for: Numbness/parasthesias Medical History: Negative for: Dementia; Neuropathy; Quadriplegia; Paraplegia; Seizure Disorder Eyes Medical History: Negative for: Cataracts; Glaucoma; Optic Neuritis Hematologic/Lymphatic Medical History: Negative for: Anemia; Hemophilia; Human Immunodeficiency Virus; Lymphedema; Sickle Cell Disease Immunological Medical History: Negative for: Lupus Erythematosus; Raynauds; Scleroderma Oncologic Medical History: Negative for: Received Chemotherapy; Received Radiation Psychiatric Medical History: Negative for: Anorexia/bulimia; Confinement Anxiety Past Medical History Notes: Mood disorder, ADHD Immunizations Pneumococcal Vaccine: Received Pneumococcal Vaccination: No Tetanus Vaccine: Last tetanus shot: 03/31/2018 Implantable Devices No devices added Family and Social History Cancer: No; Diabetes: Yes - Maternal Grandparents; Heart Disease: No; Hereditary Spherocytosis: No; Hypertension: Yes - Maternal Grandparents,Paternal Grandparents; Kidney Disease: No; Lung Disease: No; Seizures: No; Stroke: No; Thyroid Problems: No; Tuberculosis: No; Never smoker; Marital Status - Single; Alcohol Use: Never; Drug Use: No History; Caffeine Use: Moderate; Financial Concerns: No; Food, Clothing or Shelter Needs: No; Support System Lacking: No; Transportation Concerns: No Psychologist, prison and probation services) Signed: 10/03/2019 5:41:09 PM By: Baltazar Najjar MD Signed: 10/07/2019 5:57:14 PM By: Zandra Abts RN,  BSN Entered By: Zandra Abts on 10/03/2019 09:10:02 -------------------------------------------------------------------------------- SuperBill Details Patient Name: Date of Service: POONAM, [name] 10/03/2019 Medical Record [id number] Patient Account Number: [id number] Date of Birth/Sex: Treating RN: 09-15-1990 (28 y.o. Ashley [name] Primary Care Provider: [name] Other Clinician: Referring Provider: Treating Provider/Extender:Rhea Kaelin, Lewis Moccasin, Rhys Martini in Treatment: 0 Diagnosis Coding ICD-10 Codes Code Description S60.410A Abrasion of right index finger, initial encounter Physician Procedures CPT4 Code: [id number] Description: [id number] - WC PHYS LEVEL 2 - EST PT ICD-10 Diagnosis Description S60.410A Abrasion of right index finger, initial encounter Modifier: Quantity: 1 Electronic Signature(s) Signed: 10/03/2019 5:41:09 PM By: Baltazar Najjar MD Entered By: Baltazar Najjar on 10/03/2019 17:37:12

## 2020-01-30 ENCOUNTER — Ambulatory Visit: Payer: Medicare Other | Admitting: Internal Medicine

## 2020-02-02 ENCOUNTER — Other Ambulatory Visit: Payer: Self-pay | Admitting: Obstetrics & Gynecology

## 2020-02-02 DIAGNOSIS — Z3041 Encounter for surveillance of contraceptive pills: Secondary | ICD-10-CM

## 2020-02-06 ENCOUNTER — Encounter: Payer: Self-pay | Admitting: Internal Medicine

## 2020-02-06 ENCOUNTER — Other Ambulatory Visit: Payer: Self-pay

## 2020-02-06 ENCOUNTER — Ambulatory Visit (INDEPENDENT_AMBULATORY_CARE_PROVIDER_SITE_OTHER): Payer: Medicare Other | Admitting: Internal Medicine

## 2020-02-06 DIAGNOSIS — K5900 Constipation, unspecified: Secondary | ICD-10-CM

## 2020-02-06 DIAGNOSIS — J3089 Other allergic rhinitis: Secondary | ICD-10-CM | POA: Diagnosis not present

## 2020-02-06 NOTE — Assessment & Plan Note (Signed)
Using nasonex and patanase for nasal symptoms and overall doing well.

## 2020-02-06 NOTE — Progress Notes (Signed)
   Subjective:   Patient ID: Ashley [name], female    DOB: 09/12/1990, 29 y.o.   MRN: [id number]  HPI The patient is a 29 YO female coming in for follow up constipation (doing well with miralax and exercising lately which has helped, denies current problems with this), and allergies (sneezing sometimes, overall controlled with her nasonex and patanase and patanol, denies current sinus pain or cough or drainage).   Review of Systems  Constitutional: Negative.   HENT: Negative.   Eyes: Negative.   Respiratory: Negative for cough, chest tightness and shortness of breath.   Cardiovascular: Negative for chest pain, palpitations and leg swelling.  Gastrointestinal: Negative for abdominal distention, abdominal pain, constipation, diarrhea, nausea and vomiting.  Musculoskeletal: Negative.   Skin: Negative.   Neurological: Negative.   Psychiatric/Behavioral: Negative.     Objective:  Physical Exam Constitutional:      Appearance: She is well-developed.  HENT:     Head: Normocephalic and atraumatic.  Cardiovascular:     Rate and Rhythm: Normal rate and regular rhythm.  Pulmonary:     Effort: Pulmonary effort is normal. No respiratory distress.     Breath sounds: Normal breath sounds. No wheezing or rales.  Abdominal:     General: Bowel sounds are normal. There is no distension.     Palpations: Abdomen is soft.     Tenderness: There is no abdominal tenderness. There is no rebound.  Musculoskeletal:     Cervical back: Normal range of motion.  Skin:    General: Skin is warm and dry.  Neurological:     Mental Status: She is alert and oriented to person, place, and time.     Coordination: Coordination normal.     Vitals:   02/06/20 1400  BP: 118/78  Pulse: 73  Temp: 98.7 F (37.1 C)  TempSrc: Oral  SpO2: 97%  Weight: 228 lb (103.4 kg)  Height: 5\' 5"  (1.651 m)    This visit occurred during the SARS-CoV-2 public health emergency.  Safety protocols were in place, including  screening questions prior to the visit, additional usage of staff PPE, and extensive cleaning of exam room while observing appropriate contact time as indicated for disinfecting solutions.   Assessment & Plan:

## 2020-02-06 NOTE — Patient Instructions (Signed)
We are not making any changes today.    

## 2020-02-06 NOTE — Assessment & Plan Note (Signed)
Stable and encouraged exercise and hydration to help with constipation and continue miralax.

## 2020-02-08 ENCOUNTER — Encounter: Payer: Self-pay | Admitting: Internal Medicine

## 2020-02-18 ENCOUNTER — Other Ambulatory Visit: Payer: Self-pay

## 2020-02-18 DIAGNOSIS — Z3041 Encounter for surveillance of contraceptive pills: Secondary | ICD-10-CM

## 2020-02-18 MED ORDER — LEVONORGEST-ETH ESTRAD 91-DAY 0.15-0.03 &0.01 MG PO TABS: 1.0000 | ORAL_TABLET | Freq: Every day | ORAL | 0 refills | Status: DC

## 2020-03-02 ENCOUNTER — Ambulatory Visit: Payer: Medicare Other | Admitting: Podiatry

## 2020-03-09 ENCOUNTER — Other Ambulatory Visit: Payer: Self-pay

## 2020-03-09 ENCOUNTER — Ambulatory Visit (INDEPENDENT_AMBULATORY_CARE_PROVIDER_SITE_OTHER): Payer: Medicare Other | Admitting: Podiatry

## 2020-03-09 DIAGNOSIS — B351 Tinea unguium: Secondary | ICD-10-CM

## 2020-03-09 MED ORDER — CICLOPIROX 8 % EX SOLN: Freq: Every day | CUTANEOUS | 4 refills | Status: DC

## 2020-03-09 NOTE — Patient Instructions (Signed)
Ciclopirox nail solution What is this medicine? CICLOPIROX (sye kloe PEER ox) NAIL SOLUTION is an antifungal medicine. It used to treat fungal infections of the nails. This medicine may be used for other purposes; ask your health care provider or pharmacist if you have questions. COMMON BRAND NAME(S): CNL8, [place] What should I tell my health care provider before I take this medicine? They need to know if you have any of these conditions:  diabetes mellitus  history of seizures  HIV infection  immune system problems or organ transplant  large areas of burned or damaged skin  peripheral vascular disease or poor circulation  taking corticosteroid medication (including steroid inhalers, cream, or lotion)  an unusual or allergic reaction to ciclopirox, isopropyl alcohol, other medicines, foods, dyes, or preservatives  pregnant or trying to get pregnant  breast-feeding How should I use this medicine? This medicine is for external use only. Follow the directions that come with this medicine exactly. Wash and dry your hands before use. Avoid contact with the eyes, mouth or nose. If you do get this medicine in your eyes, rinse out with plenty of cool tap water. Contact your doctor or health care professional if eye irritation occurs. Use at regular intervals. Do not use your medicine more often than directed. Finish the full course prescribed by your doctor or health care professional even if you think you are better. Do not stop using except on your doctor's advice. Talk to your pediatrician regarding the use of this medicine in children. While this medicine may be prescribed for children as young as 12 years for selected conditions, precautions do apply. Overdosage: If you think you have taken too much of this medicine contact a poison control center or emergency room at once. NOTE: This medicine is only for you. Do not share this medicine with others. What if I miss a dose? If you miss a  dose, use it as soon as you can. If it is almost time for your next dose, use only that dose. Do not use double or extra doses. What may interact with this medicine? Interactions are not expected. Do not use any other skin products without telling your doctor or health care professional. This list may not describe all possible interactions. Give your health care provider a list of all the medicines, herbs, non-prescription drugs, or dietary supplements you use. Also tell them if you smoke, drink alcohol, or use illegal drugs. Some items may interact with your medicine. What should I watch for while using this medicine? Tell your doctor or health care professional if your symptoms get worse. Four to six months of treatment may be needed for the nail(s) to improve. Some people may not achieve a complete cure or clearing of the nails by this time. Tell your doctor or health care professional if you develop sores or blisters that do not heal properly. If your nail infection returns after stopping using this product, contact your doctor or health care professional. What side effects may I notice from receiving this medicine? Side effects that you should report to your doctor or health care professional as soon as possible:  allergic reactions like skin rash, itching or hives, swelling of the face, lips, or tongue  severe irritation, redness, burning, blistering, peeling, swelling, oozing Side effects that usually do not require medical attention (report to your doctor or health care professional if they continue or are bothersome):  mild reddening of the skin  nail discoloration  temporary burning or mild   stinging at the site of application This list may not describe all possible side effects. Call your doctor for medical advice about side effects. You may report side effects to FDA at 1-800-FDA-1088. Where should I keep my medicine? Keep out of the reach of children. Store at room temperature  between 15 and 30 degrees C (59 and 86 degrees F). Do not freeze. Protect from light by storing the bottle in the carton after every use. This medicine is flammable. Keep away from heat and flame. Throw away any unused medicine after the expiration date. NOTE: This sheet is a summary. It may not cover all possible information. If you have questions about this medicine, talk to your doctor, pharmacist, or health care provider.  2020 Elsevier/Gold Standard (2007-10-29 16:49:20)  

## 2020-03-16 DIAGNOSIS — B351 Tinea unguium: Secondary | ICD-10-CM | POA: Insufficient documentation

## 2020-03-16 NOTE — Progress Notes (Signed)
Subjective:   Patient ID: Ashley [name], female   DOB: 29 y.o.   MRN: [id number]   HPI 29 year old female presents the office today for concerns of thick, discolored toenails and for fungus.  She states that she has no pain the nails.  She said no recent treatment.  Denies any drainage or pus or any swelling.  She has no other concerns today.   Review of Systems  All other systems reviewed and are negative.  Past Medical History:  Diagnosis Date   ADHD (attention deficit hyperactivity disorder)    Mood disorder (HCC)    Seizures (HCC)    childhood    Past Surgical History:  Procedure Laterality Date   TONSILLECTOMY AND ADENOIDECTOMY       Current Outpatient Medications:    Levonorgestrel-Ethinyl Estradiol (AMETHIA) 0.15-0.03 &0.01 MG tablet, Take 1 tablet by mouth daily., Disp: 30 tablet, Rfl: 0   mometasone (NASONEX) 50 MCG/ACT nasal spray, Use 1-2 sprays in each nostril once daily for stuffy nose or drainage., Disp: 17 g, Rfl: 3   olopatadine (PATANOL) 0.1 % ophthalmic solution, Can use one drop in each eye twice a day as needed., Disp: 5 mL, Rfl: 5   Olopatadine HCl (PATANASE) 0.6 % SOLN, Place 2 sprays into the nose daily., Disp: 30.5 g, Rfl: 11   paliperidone (INVEGA) 6 MG 24 hr tablet, Take 6 mg by mouth every morning., Disp: , Rfl:    polyethylene glycol powder (GLYCOLAX/MIRALAX) powder, Take 1/2 capful (8 grams) daily dissolved in at least 8 ounces water/juice, Disp: 527 g, Rfl: 1   ranitidine (ZANTAC) 150 MG tablet, Take 1 tablet (150 mg total) by mouth 2 (two) times daily., Disp: 60 tablet, Rfl: 3   ciclopirox (PENLAC) 8 % solution, Apply topically at bedtime. Apply over nail and surrounding skin. Apply daily over previous coat. After seven (7) days, may remove with alcohol and continue cycle., Disp: 6.6 mL, Rfl: 4  Allergies  Allergen Reactions   Doxycycline Rash         Objective:  Physical Exam  General: NAD-presents with family  member  Dermatological: Nails appear to be hypertrophic, dystrophic with yellow-brown discoloration.  There is no hyperpigmentation into the nails or the surrounding skin.  There is no drainage or pus identified there is no swelling.  No open lesions.  Vascular: Dorsalis Pedis artery and Posterior Tibial artery pedal pulses are 2/4 bilateral with immedate capillary fill time.  There is no pain with calf compression, swelling, warmth, erythema.   Neruologic: Grossly intact via light touch bilateral.  Musculoskeletal: No gross boney pedal deformities bilateral. No pain, crepitus, or limitation noted with foot and ankle range of motion bilateral. Muscular strength 5/5 in all groups tested bilateral.  Gait: Unassisted, Nonantalgic.      Assessment:   Onychomycosis     Plan:  -Treatment options discussed including all alternatives, risks, and complications -Etiology of symptoms were discussed -Discussed various treatment options.  After discussion was proceed with topical.  Prescribed Penlac and discussed side effects and success rates.  Discussed external measures to help with fungus including shoe changes, changing socks regularly as well as drying thoroughly to the feet washing with soap and water daily.  Return if symptoms worsen or fail to improve.  Vivi Barrack DPM

## 2020-03-19 ENCOUNTER — Ambulatory Visit: Payer: Medicare Other | Admitting: Obstetrics & Gynecology

## 2020-03-20 ENCOUNTER — Ambulatory Visit (INDEPENDENT_AMBULATORY_CARE_PROVIDER_SITE_OTHER): Payer: Medicare Other | Admitting: Women's Health

## 2020-03-20 ENCOUNTER — Other Ambulatory Visit: Payer: Self-pay

## 2020-03-20 ENCOUNTER — Other Ambulatory Visit (HOSPITAL_COMMUNITY)
Admission: RE | Admit: 2020-03-20 | Discharge: 2020-03-20 | Disposition: A | Discharge status: Home / Self Care | Payer: Medicare Other | Source: Ambulatory Visit | Attending: Obstetrics & Gynecology | Admitting: Obstetrics & Gynecology

## 2020-03-20 ENCOUNTER — Encounter: Payer: Self-pay | Admitting: Women's Health

## 2020-03-20 VITALS — BP 126/83 | HR 87 | Wt 228.0 lb

## 2020-03-20 DIAGNOSIS — Z113 Encounter for screening for infections with a predominantly sexual mode of transmission: Secondary | ICD-10-CM | POA: Insufficient documentation

## 2020-03-20 DIAGNOSIS — Z01419 Encounter for gynecological examination (general) (routine) without abnormal findings: Secondary | ICD-10-CM

## 2020-03-20 MED ORDER — LEVONORGEST-ETH ESTRAD 91-DAY 0.15-0.03 &0.01 MG PO TABS: 1.0000 | ORAL_TABLET | Freq: Every day | ORAL | 4 refills | Status: DC

## 2020-03-20 NOTE — Progress Notes (Signed)
Pt is here for annual gyn exam. Last pap 02/07/18, normal. Taking OCP's, pt requesting refill at this time. Pt reports periods every 3 months.

## 2020-03-20 NOTE — Progress Notes (Signed)
GYNECOLOGY ANNUAL PREVENTATIVE CARE ENCOUNTER NOTE  History:     Ashley [name] is a 29 y.o. G0P0000 female here for a routine annual gynecologic exam.  Current complaints: none.   Denies abnormal vaginal bleeding, discharge, pelvic pain, problems with intercourse or other gynecologic concerns.  Pt reports invega, mucinex PRN as only medications. Pt reports NKDA, but chart notes doxycycline. Pt denies any current medical concerns aside from mental health. Patient reports she does not feel that birth control makes her mood worse.  Pt requests STD testing today, vaginal swab only. Pt reports she does not perform SBE. Pt denies bowel or bladder concerns. No family hx of breast, colon, endometrial or ovarian cancer. Pt does not smoke, drink or use drugs. Last dental exam: has appt in November. Last eye exam: 2016.  Patient's sister and legal guardian present for interview portion of visit.       Gynecologic History Patient's last menstrual period was 03/06/2020 (approximate). Menstruation: every 3 months d/t continuous use, 7 days, moderate bleeding, mild dysmenorrhea. Contraception: OCP (estrogen/progesterone). Pt reports she doesnot desire pregnancy in the next year. Last Pap: 02/2018. Results were: normal per EPIC. Pt denies history of abnormal Pap.  Obstetric History OB History  Gravida Para Term Preterm AB Living  0 0 0 0 0 0  SAB TAB Ectopic Multiple Live Births  0 0 0 0      Past Medical History:  Diagnosis Date  . ADHD (attention deficit hyperactivity disorder)   . Mood disorder (HCC)   . Seizures (HCC)    childhood    Past Surgical History:  Procedure Laterality Date  . TONSILLECTOMY AND ADENOIDECTOMY      Current Outpatient Medications on File Prior to Visit  Medication Sig Dispense Refill  . ciclopirox (PENLAC) 8 % solution Apply topically at bedtime. Apply over nail and surrounding skin. Apply daily over previous coat. After seven (7) days, may remove  with alcohol and continue cycle. 6.6 mL 4  . mometasone (NASONEX) 50 MCG/ACT nasal spray Use 1-2 sprays in each nostril once daily for stuffy nose or drainage. 17 g 3  . olopatadine (PATANOL) 0.1 % ophthalmic solution Can use one drop in each eye twice a day as needed. 5 mL 5  . Olopatadine HCl (PATANASE) 0.6 % SOLN Place 2 sprays into the nose daily. 30.5 g 11  . paliperidone (INVEGA) 6 MG 24 hr tablet Take 6 mg by mouth every morning.    . polyethylene glycol powder (GLYCOLAX/MIRALAX) powder Take 1/2 capful (8 grams) daily dissolved in at least 8 ounces water/juice 527 g 1  . ranitidine (ZANTAC) 150 MG tablet Take 1 tablet (150 mg total) by mouth 2 (two) times daily. 60 tablet 3   No current facility-administered medications on file prior to visit.    Allergies  Allergen Reactions  . Doxycycline Rash    Social History:  reports that she has never smoked. She has never used smokeless tobacco. She reports that she does not drink alcohol and does not use drugs.  Family History  Problem Relation Age of Onset  . Diabetes Mother   . Asthma Sister   . Stroke Maternal Uncle     The following portions of the patient's history were reviewed and updated as appropriate: allergies, current medications, past family history, past medical history, past social history, past surgical history and problem list.  Review of Systems Pertinent items noted in HPI and remainder of comprehensive ROS otherwise negative.  Physical Exam:  BP 126/83   Pulse 87   Wt 228 lb (103.4 kg)   LMP 03/06/2020 (Approximate)   BMI 37.94 kg/m  CONSTITUTIONAL: Well-developed, well-nourished female in no acute distress.  HENT:  Normocephalic, atraumatic, External right and left ear normal. EYES: Conjunctivae and EOM are normal. Pupils are equal, round, and reactive to light. No scleral icterus.  NECK: Normal range of motion, supple, no masses.  Normal thyroid.  SKIN: Skin is warm and dry. No rash noted. Not  diaphoretic. No erythema. No pallor. MUSCULOSKELETAL: Normal range of motion. No tenderness.  No cyanosis, clubbing, or edema. NEUROLOGIC: Alert and oriented to person, place, and time. Normal reflexes, muscle tone coordination. PSYCHIATRIC: Normal mood and affect. Excited behavior. Normal judgment and thought content. CARDIOVASCULAR: Normal heart rate noted, regular rhythm. RESPIRATORY: Clear to auscultation bilaterally. Effort and breath sounds normal, no problems with respiration noted. BREASTS: Symmetric in size. No masses, skin changes, nipple drainage, or lymphadenopathy. ABDOMEN: Soft, normal bowel sounds, no distention noted.  No tenderness, rebound or guarding.  PELVIC: Normal appearing external genitalia; swab only performed   Assessment and Plan:     1. Well woman exam - Levonorgestrel-Ethinyl Estradiol (AMETHIA) 0.15-0.03 &0.01 MG tablet; Take 1 tablet by mouth daily.  Dispense: 91 tablet; Refill: 4  2. Screening for STD (sexually transmitted disease) - Cervicovaginal ancillary only  Will follow up results of pap smear and other testing, if performed, and manage accordingly. Routine preventative health maintenance measures emphasized. Self-breast awareness taught, importance discussed, advised when to RTC, SBA literature given. Please refer to After Visit Summary for other counseling recommendations.      Gearldine Shown, [facility] Nurse Practitioner, [facility] for [employer], [facility]

## 2020-03-20 NOTE — Patient Instructions (Signed)
Preventive Care 21-28 Years Old, Female Preventive care refers to visits with your health care provider and lifestyle choices that can promote health and wellness. This includes:  A yearly physical exam. This may also be called an annual well check.  Regular dental visits and eye exams.  Immunizations.  Screening for certain conditions.  Healthy lifestyle choices, such as eating a healthy diet, getting regular exercise, not using drugs or products that contain nicotine and tobacco, and limiting alcohol use. What can I expect for my preventive care visit? Physical exam Your health care provider will check your:  Height and weight. This may be used to calculate body mass index (BMI), which tells if you are at a healthy weight.  Heart rate and blood pressure.  Skin for abnormal spots. Counseling Your health care provider may ask you questions about your:  Alcohol, tobacco, and drug use.  Emotional well-being.  Home and relationship well-being.  Sexual activity.  Eating habits.  Work and work environment.  Method of birth control.  Menstrual cycle.  Pregnancy history. What immunizations do I need?  Influenza (flu) vaccine  This is recommended every year. Tetanus, diphtheria, and pertussis (Tdap) vaccine  You may need a Td booster every 10 years. Varicella (chickenpox) vaccine  You may need this if you have not been vaccinated. Human papillomavirus (HPV) vaccine  If recommended by your health care provider, you may need three doses over 6 months. Measles, mumps, and rubella (MMR) vaccine  You may need at least one dose of MMR. You may also need a second dose. Meningococcal conjugate (MenACWY) vaccine  One dose is recommended if you are age 19-21 years and a first-year college student living in a residence hall, or if you have one of several medical conditions. You may also need additional booster doses. Pneumococcal conjugate (PCV13) vaccine  You may need  this if you have certain conditions and were not previously vaccinated. Pneumococcal polysaccharide (PPSV23) vaccine  You may need one or two doses if you smoke cigarettes or if you have certain conditions. Hepatitis A vaccine  You may need this if you have certain conditions or if you travel or work in places where you may be exposed to hepatitis A. Hepatitis B vaccine  You may need this if you have certain conditions or if you travel or work in places where you may be exposed to hepatitis B. Haemophilus influenzae type b (Hib) vaccine  You may need this if you have certain conditions. You may receive vaccines as individual doses or as more than one vaccine together in one shot (combination vaccines). Talk with your health care provider about the risks and benefits of combination vaccines. What tests do I need?  Blood tests  Lipid and cholesterol levels. These may be checked every 5 years starting at age 20.  Hepatitis C test.  Hepatitis B test. Screening  Diabetes screening. This is done by checking your blood sugar (glucose) after you have not eaten for a while (fasting).  Sexually transmitted disease (STD) testing.  BRCA-related cancer screening. This may be done if you have a family history of breast, ovarian, tubal, or peritoneal cancers.  Pelvic exam and Pap test. This may be done every 3 years starting at age 21. Starting at age 30, this may be done every 5 years if you have a Pap test in combination with an HPV test. Talk with your health care provider about your test results, treatment options, and if necessary, the need for more tests.   Follow these instructions at home: Eating and drinking   Eat a diet that includes fresh fruits and vegetables, whole grains, lean protein, and low-fat dairy.  Take vitamin and mineral supplements as recommended by your health care provider.  Do not drink alcohol if: ? Your health care provider tells you not to drink. ? You are  pregnant, may be pregnant, or are planning to become pregnant.  If you drink alcohol: ? Limit how much you have to 0-1 drink a day. ? Be aware of how much alcohol is in your drink. In the [place] one drink equals one 12 oz bottle of beer (355 mL), one 5 oz glass of wine (148 mL), or one 1 oz glass of hard liquor (44 mL). Lifestyle  Take daily care of your teeth and gums.  Stay active. Exercise for at least 30 minutes on 5 or more days each week.  Do not use any products that contain nicotine or tobacco, such as cigarettes, e-cigarettes, and chewing tobacco. If you need help quitting, ask your health care provider.  If you are sexually active, practice safe sex. Use a condom or other form of birth control (contraception) in order to prevent pregnancy and STIs (sexually transmitted infections). If you plan to become pregnant, see your health care provider for a preconception visit. What's next?  Visit your health care provider once a year for a well check visit.  Ask your health care provider how often you should have your eyes and teeth checked.  Stay up to date on all vaccines. This information is not intended to replace advice given to you by your health care provider. Make sure you discuss any questions you have with your health care provider. Document Revised: 04/05/2018 Document Reviewed: 04/05/2018 Elsevier Patient Education  2020 Elsevier Inc.        Breast Self-Awareness Breast self-awareness means being familiar with how your breasts look and feel. It involves checking your breasts regularly and reporting any changes to your health care provider. Practicing breast self-awareness is important. Sometimes changes may not be harmful (are benign), but sometimes a change in your breasts can be a sign of a serious medical problem. It is important to learn how to do this procedure correctly so that you can catch problems early, when treatment is more likely to be successful. All  women should practice breast self-awareness, including women who have had breast implants. What you need:  A mirror.  A well-lit room. How to do a breast self-exam A breast self-exam is one way to learn what is normal for your breasts and whether your breasts are changing. To do a breast self-exam: Look for changes  1. Remove all the clothing above your waist. 2. Stand in front of a mirror in a room with good lighting. 3. Put your hands on your hips. 4. Push your hands firmly downward. 5. Compare your breasts in the mirror. Look for differences between them (asymmetry), such as: ? Differences in shape. ? Differences in size. ? Puckers, dips, and bumps in one breast and not the other. 6. Look at each breast for changes in the skin, such as: ? Redness. ? Scaly areas. 7. Look for changes in your nipples, such as: ? Discharge. ? Bleeding. ? Dimpling. ? Redness. ? A change in position. Feel for changes Carefully feel your breasts for lumps and changes. It is best to do this while lying on your back on the floor, and again while sitting or standing in the tub   on your skin. Feel each breast in the following way: 1. Place the arm on the side of the breast you are examining above your head. 2. Feel your breast with the other hand. 3. Start in the nipple area and make -inch (2 cm) overlapping circles to feel your breast. Use the pads of your three middle fingers to do this. Apply light pressure, then medium pressure, then firm pressure. The light pressure will allow you to feel the tissue closest to the skin. The medium pressure will allow you to feel the tissue that is a little deeper. The firm pressure will allow you to feel the tissue close to the ribs. 4. Continue the overlapping circles, moving downward over the breast until you feel your ribs below your breast. 5. Move one finger-width toward the center of the body. Continue to use the -inch (2 cm) overlapping circles to  feel your breast as you move slowly up toward your collarbone. 6. Continue the up-and-down exam using all three pressures until you reach your armpit.  Write down what you find Writing down what you find can help you remember what to discuss with your health care provider. Write down:  What is normal for each breast.  Any changes that you find in each breast, including: ? The kind of changes you find. ? Any pain or tenderness. ? Size and location of any lumps.  Where you are in your menstrual cycle, if you are still menstruating. General tips and recommendations  Examine your breasts every month.  If you are breastfeeding, the best time to examine your breasts is after a feeding or after using a breast pump.  If you menstruate, the best time to examine your breasts is 5-7 days after your period. Breasts are generally lumpier during menstrual periods, and it may be more difficult to notice changes.  With time and practice, you will become more familiar with the variations in your breasts and more comfortable with the exam. Contact a health care provider if you:  See a change in the shape or size of your breasts or nipples.  See a change in the skin of your breast or nipples, such as a reddened or scaly area.  Have unusual discharge from your nipples.  Find a lump or thick area that was not there before.  Have pain in your breasts.  Have any concerns related to your breast health. Summary  Breast self-awareness includes looking for physical changes in your breasts, as well as feeling for any changes within your breasts.  Breast self-awareness should be performed in front of a mirror in a well-lit room.  You should examine your breasts every month. If you menstruate, the best time to examine your breasts is 5-7 days after your menstrual period.  Let your health care provider know of any changes you notice in your breasts, including changes in size, changes on the skin, pain  or tenderness, or unusual fluid from your nipples. This information is not intended to replace advice given to you by your health care provider. Make sure you discuss any questions you have with your health care provider. Document Revised: 03/13/2018 Document Reviewed: 03/13/2018 Elsevier Patient Education  2020 Elsevier Inc.   

## 2020-03-23 ENCOUNTER — Encounter: Payer: Self-pay | Admitting: Internal Medicine

## 2020-03-23 LAB — CERVICOVAGINAL ANCILLARY ONLY
Chlamydia: NEGATIVE
Comment: NEGATIVE
Comment: NEGATIVE
Comment: NORMAL
Neisseria Gonorrhea: NEGATIVE
Trichomonas: NEGATIVE

## 2020-03-24 ENCOUNTER — Other Ambulatory Visit: Payer: Self-pay | Admitting: Family

## 2020-03-24 DIAGNOSIS — R238 Other skin changes: Secondary | ICD-10-CM

## 2020-04-15 DIAGNOSIS — L732 Hidradenitis suppurativa: Secondary | ICD-10-CM | POA: Diagnosis not present

## 2020-04-15 DIAGNOSIS — L83 Acanthosis nigricans: Secondary | ICD-10-CM | POA: Diagnosis not present

## 2020-05-02 ENCOUNTER — Other Ambulatory Visit: Payer: Self-pay

## 2020-05-02 ENCOUNTER — Ambulatory Visit (HOSPITAL_COMMUNITY)
Admission: EM | Admit: 2020-05-02 | Discharge: 2020-05-02 | Disposition: A | Discharge status: Home / Self Care | Payer: Medicare Other | Attending: Urgent Care | Admitting: Urgent Care

## 2020-05-02 ENCOUNTER — Encounter (HOSPITAL_COMMUNITY): Payer: Self-pay

## 2020-05-02 DIAGNOSIS — Z881 Allergy status to other antibiotic agents status: Secondary | ICD-10-CM | POA: Insufficient documentation

## 2020-05-02 DIAGNOSIS — F909 Attention-deficit hyperactivity disorder, unspecified type: Secondary | ICD-10-CM | POA: Insufficient documentation

## 2020-05-02 DIAGNOSIS — J0100 Acute maxillary sinusitis, unspecified: Secondary | ICD-10-CM | POA: Insufficient documentation

## 2020-05-02 DIAGNOSIS — R05 Cough: Secondary | ICD-10-CM | POA: Diagnosis present

## 2020-05-02 DIAGNOSIS — Z793 Long term (current) use of hormonal contraceptives: Secondary | ICD-10-CM | POA: Insufficient documentation

## 2020-05-02 DIAGNOSIS — Z79899 Other long term (current) drug therapy: Secondary | ICD-10-CM | POA: Insufficient documentation

## 2020-05-02 DIAGNOSIS — F39 Unspecified mood [affective] disorder: Secondary | ICD-10-CM | POA: Diagnosis not present

## 2020-05-02 DIAGNOSIS — Z6837 Body mass index (BMI) 37.0-37.9, adult: Secondary | ICD-10-CM | POA: Diagnosis not present

## 2020-05-02 DIAGNOSIS — Z20822 Contact with and (suspected) exposure to covid-19: Secondary | ICD-10-CM | POA: Insufficient documentation

## 2020-05-02 DIAGNOSIS — Z7951 Long term (current) use of inhaled steroids: Secondary | ICD-10-CM | POA: Diagnosis not present

## 2020-05-02 DIAGNOSIS — E669 Obesity, unspecified: Secondary | ICD-10-CM | POA: Insufficient documentation

## 2020-05-02 LAB — SARS CORONAVIRUS 2 (TAT 6-24 HRS): SARS Coronavirus 2: NEGATIVE

## 2020-05-02 MED ORDER — AMOXICILLIN 400 MG/5ML PO SUSR: 875.0000 mg | Freq: Two times a day (BID) | ORAL | 0 refills | Status: AC

## 2020-05-02 MED ORDER — PROMETHAZINE-DM 6.25-15 MG/5ML PO SYRP
5.0000 mL | ORAL_SOLUTION | Freq: Four times a day (QID) | ORAL | 0 refills | Status: DC | PRN
Start: 2020-05-02 — End: 2020-05-20

## 2020-05-02 NOTE — ED Provider Notes (Signed)
[facility]    CSN: [id number] Arrival date & time: 05/02/20  1050      History   Chief Complaint Chief Complaint  Patient presents with  . Cough    HPI Ashley [name] is a 29 y.o. female.   Here today with legal guardian for over a week of worsening left sided facial pain and pressure, sore throat, cough, headache, malaise. Denies known fever, chills, body aches, CP, SOB, N/V/D. So far taking dayquil, nyquil, cough drops, and mucinex without much relief. No known sick contacts, no recent travel, no hx of asthma. Consistent with allergy regimen.      Past Medical History:  Diagnosis Date  . ADHD (attention deficit hyperactivity disorder)   . Mood disorder (HCC)   . Seizures (HCC)    childhood    Patient Active Problem List   Diagnosis Date Noted  . Onychomycosis 03/16/2020  . Abscess 08/01/2019  . Routine general medical examination at a health care facility 07/26/2018  . Allergic rhinitis 07/24/2017  . Dysmenorrhea 01/25/2013  . Obesity (BMI 35.0-39.9 without comorbidity) 12/21/2011  . Mood disorder (HCC) 12/21/2011  . Constipation 12/21/2011  . ADD (attention deficit disorder) 02/19/2010    Past Surgical History:  Procedure Laterality Date  . TONSILLECTOMY AND ADENOIDECTOMY      OB History    Gravida  0   Para  0   Term  0   Preterm  0   AB  0   Living  0     SAB  0   TAB  0   Ectopic  0   Multiple  0   Live Births               Home Medications    Prior to Admission medications   Medication Sig Start Date End Date Taking? Authorizing Provider  Levonorgestrel-Ethinyl Estradiol (SEASONIQUE) 0.15-0.03 &0.01 MG tablet Take 1 tablet by mouth daily.   Yes [provider]  amoxicillin (AMOXIL) 400 MG/5ML suspension Take 10.9 mLs (875 mg total) by mouth 2 (two) times daily for 7 days. 05/02/20 05/09/20  Particia Nearing, PA-C  ciclopirox [facility]) 8 % solution Apply topically at bedtime. Apply over nail and  surrounding skin. Apply daily over previous coat. After seven (7) days, may remove with alcohol and continue cycle. 03/09/20   Vivi Barrack, DPM  Levonorgestrel-Ethinyl Estradiol (AMETHIA) 0.15-0.03 &0.01 MG tablet Take 1 tablet by mouth daily. 03/20/20   Nugent, Odie Sera, NP  mometasone (NASONEX) 50 MCG/ACT nasal spray Use 1-2 sprays in each nostril once daily for stuffy nose or drainage. 01/23/18   Myrlene Broker, MD  olopatadine (PATANOL) 0.1 % ophthalmic solution Can use one drop in each eye twice a day as needed. 09/13/16   Kozlow, Alvira Philips, MD  Olopatadine HCl (PATANASE) 0.6 % SOLN Place 2 sprays into the nose daily. 01/23/18   Myrlene Broker, MD  paliperidone (INVEGA) 6 MG 24 hr tablet Take 6 mg by mouth every morning. 03/06/20   [provider]  polyethylene glycol powder (GLYCOLAX/MIRALAX) powder Take 1/2 capful (8 grams) daily dissolved in at least 8 ounces water/juice 09/11/17   Pyrtle, Carie Caddy, MD  promethazine-dextromethorphan (PROMETHAZINE-DM) 6.25-15 MG/5ML syrup Take 5 mLs by mouth 4 (four) times daily as needed for cough. 05/02/20   Particia Nearing, PA-C  ranitidine (ZANTAC) 150 MG tablet Take 1 tablet (150 mg total) by mouth 2 (two) times daily. 09/11/17   Pyrtle, Carie Caddy, MD  Family History Family History  Problem Relation Age of Onset  . Diabetes Mother   . Asthma Sister   . Stroke Maternal Uncle     Social History Social History   Tobacco Use  . Smoking status: Never Smoker  . Smokeless tobacco: Never Used  Vaping Use  . Vaping Use: Never used  Substance Use Topics  . Alcohol use: No  . Drug use: No     Allergies   Doxycycline   Review of Systems Review of Systems PER HPI    Physical Exam Triage Vital Signs ED Triage Vitals  Enc Vitals Group     BP 05/02/20 1231 120/85     Pulse Rate 05/02/20 1231 87     Resp 05/02/20 1231 18     Temp 05/02/20 1231 98.2 F (36.8 C)     Temp Source 05/02/20 1231 Oral     SpO2 05/02/20 1231  100 %     Weight 05/02/20 1234 225 lb (102.1 kg)     Height 05/02/20 1234 5\' 5"  (1.651 m)     Head Circumference --      Peak Flow --      Pain Score 05/02/20 1234 4     Pain Loc --      Pain Edu? --      Excl. in GC? --    No data found.  Updated Vital Signs BP 120/85   Pulse 87   Temp 98.2 F (36.8 C) (Oral)   Resp 18   Ht 5\' 5"  (1.651 m)   Wt 225 lb (102.1 kg)   SpO2 100%   BMI 37.44 kg/m   Visual Acuity Right Eye Distance:   Left Eye Distance:   Bilateral Distance:    Right Eye Near:   Left Eye Near:    Bilateral Near:     Physical Exam Vitals and nursing note reviewed.  Constitutional:      Appearance: Normal appearance. She is not ill-appearing.  HENT:     Head: Atraumatic.     Comments: Left maxillary sinus ttp     Right Ear: Tympanic membrane normal.     Left Ear: Tympanic membrane normal.     Nose: Congestion present.     Mouth/Throat:     Mouth: Mucous membranes are moist.     Pharynx: Posterior oropharyngeal erythema present. No oropharyngeal exudate.  Eyes:     Extraocular Movements: Extraocular movements intact.     Conjunctiva/sclera: Conjunctivae normal.  Cardiovascular:     Rate and Rhythm: Normal rate and regular rhythm.     Heart sounds: Normal heart sounds.  Pulmonary:     Effort: Pulmonary effort is normal.     Breath sounds: Normal breath sounds. No wheezing or rales.  Abdominal:     General: Bowel sounds are normal. There is no distension.     Palpations: Abdomen is soft.     Tenderness: There is no abdominal tenderness. There is no guarding.  Musculoskeletal:        General: Normal range of motion.     Cervical back: Normal range of motion and neck supple.  Skin:    General: Skin is warm and dry.  Neurological:     Mental Status: She is alert and oriented to person, place, and time.  Psychiatric:        Mood and Affect: Mood normal.        Thought Content: Thought content normal.        Judgment: Judgment  normal.     UC  Treatments / Results  Labs (all labs ordered are listed, but only abnormal results are displayed) Labs Reviewed  SARS CORONAVIRUS 2 (TAT 6-24 HRS)    EKG   Radiology No results found.  Procedures Procedures (including critical care time)  Medications Ordered in UC Medications - No data to display  Initial Impression / Assessment and Plan / UC Course  I have reviewed the triage vital signs and the nursing notes.  Pertinent labs & imaging results that were available during my care of the patient were reviewed by me and considered in my medical decision making (see chart for details).     COVID pcr pending, likely viral illness that has progressed to a bacterial sinusitis given duration and worsening sinus sxs. Tx with amoxil (pt prefers liquid), phenergan DM cough syrup for bedtime use, and discussed continued OTC supportive medications and home care. Reviewed isolation protocol until COVID results return and feeling better. Return if sxs worsening at any time.    Final Clinical Impressions(s) / UC Diagnoses   Final diagnoses:  Acute non-recurrent maxillary sinusitis   Discharge Instructions   None    ED Prescriptions    Medication Sig Dispense Auth. Provider   amoxicillin (AMOXIL) 400 MG/5ML suspension Take 10.9 mLs (875 mg total) by mouth 2 (two) times daily for 7 days. 152.6 mL Particia Nearing, PA-C   promethazine-dextromethorphan (PROMETHAZINE-DM) 6.25-15 MG/5ML syrup Take 5 mLs by mouth 4 (four) times daily as needed for cough. 100 mL Particia Nearing, [place]     PDMP not reviewed this encounter.   Particia Nearing, [place] 05/02/20 1336

## 2020-05-02 NOTE — ED Triage Notes (Signed)
Pt c/o productive cough w/clear mucous, HA, nose painx1 wk.

## 2020-05-06 ENCOUNTER — Ambulatory Visit
Admission: RE | Admit: 2020-05-06 | Discharge: 2020-05-06 | Disposition: A | Discharge status: Home / Self Care | Payer: Medicare Other | Source: Ambulatory Visit | Attending: Emergency Medicine | Admitting: Emergency Medicine

## 2020-05-06 ENCOUNTER — Other Ambulatory Visit: Payer: Self-pay

## 2020-05-06 VITALS — BP 115/82 | HR 86 | Temp 97.4°F | Resp 18

## 2020-05-06 DIAGNOSIS — R05 Cough: Secondary | ICD-10-CM

## 2020-05-06 DIAGNOSIS — R059 Cough, unspecified: Secondary | ICD-10-CM

## 2020-05-06 MED ORDER — BENZONATATE 100 MG PO CAPS: 100.0000 mg | ORAL_CAPSULE | Freq: Three times a day (TID) | ORAL | 0 refills | Status: DC

## 2020-05-06 NOTE — Discharge Instructions (Signed)

## 2020-05-06 NOTE — ED Provider Notes (Signed)
[facility]    CSN: [id number] Arrival date & time: 05/06/20  1608      History   Chief Complaint Chief Complaint  Patient presents with  . Cough    since saturday  . Hoarse    since saturday    HPI Ashley [name] is a 29 y.o. female  Presenting for worsening cough for the last 2 days.  Patient seen on Saturday by her PCP for few day course of sinus congestion.  Treated for sinus infection with amoxicillin.  Has been compliant with dosing: Has a few days left.  States nasal congestion has improved.  Cough is dry and without hemoptysis, chest pain, difficulty breathing, or fever.  Underwent Covid testing Saturday as well: Negative.  Past Medical History:  Diagnosis Date  . ADHD (attention deficit hyperactivity disorder)   . Mood disorder (HCC)   . Seizures (HCC)    childhood    Patient Active Problem List   Diagnosis Date Noted  . Onychomycosis 03/16/2020  . Abscess 08/01/2019  . Routine general medical examination at a health care facility 07/26/2018  . Allergic rhinitis 07/24/2017  . Dysmenorrhea 01/25/2013  . Obesity (BMI 35.0-39.9 without comorbidity) 12/21/2011  . Mood disorder (HCC) 12/21/2011  . Constipation 12/21/2011  . ADD (attention deficit disorder) 02/19/2010    Past Surgical History:  Procedure Laterality Date  . TONSILLECTOMY AND ADENOIDECTOMY      OB History    Gravida  0   Para  0   Term  0   Preterm  0   AB  0   Living  0     SAB  0   TAB  0   Ectopic  0   Multiple  0   Live Births               Home Medications    Prior to Admission medications   Medication Sig Start Date End Date Taking? Authorizing Provider  amoxicillin (AMOXIL) 400 MG/5ML suspension Take 10.9 mLs (875 mg total) by mouth 2 (two) times daily for 7 days. 05/02/20 05/09/20 Yes Particia Nearing, PA-C  ciclopirox [facility]) 8 % solution Apply topically at bedtime. Apply over nail and surrounding skin. Apply daily over previous coat.  After seven (7) days, may remove with alcohol and continue cycle. 03/09/20  Yes Vivi Barrack, DPM  promethazine-dextromethorphan (PROMETHAZINE-DM) 6.25-15 MG/5ML syrup Take 5 mLs by mouth 4 (four) times daily as needed for cough. 05/02/20  Yes Particia Nearing, PA-C  ranitidine (ZANTAC) 150 MG tablet Take 1 tablet (150 mg total) by mouth 2 (two) times daily. 09/11/17  Yes Pyrtle, Carie Caddy, MD  benzonatate (TESSALON) 100 MG capsule Take 1 capsule (100 mg total) by mouth every 8 (eight) hours. 05/06/20   Hall-Potvin, [place], PA-C  Levonorgestrel-Ethinyl Estradiol (AMETHIA) 0.15-0.03 &0.01 MG tablet Take 1 tablet by mouth daily. 03/20/20   Nugent, Odie Sera, NP  Levonorgestrel-Ethinyl Estradiol (SEASONIQUE) 0.15-0.03 &0.01 MG tablet Take 1 tablet by mouth daily.    [provider]  mometasone (NASONEX) 50 MCG/ACT nasal spray Use 1-2 sprays in each nostril once daily for stuffy nose or drainage. 01/23/18   Myrlene Broker, MD  olopatadine (PATANOL) 0.1 % ophthalmic solution Can use one drop in each eye twice a day as needed. 09/13/16   Kozlow, Alvira Philips, MD  Olopatadine HCl (PATANASE) 0.6 % SOLN Place 2 sprays into the nose daily. 01/23/18   Myrlene Broker, MD  paliperidone (INVEGA) 6 MG  24 hr tablet Take 6 mg by mouth every morning. 03/06/20   [provider]  polyethylene glycol powder (GLYCOLAX/MIRALAX) powder Take 1/2 capful (8 grams) daily dissolved in at least 8 ounces water/juice 09/11/17   Pyrtle, Carie Caddy, MD    Family History Family History  Problem Relation Age of Onset  . Diabetes Mother   . Asthma Sister   . Stroke Maternal Uncle     Social History Social History   Tobacco Use  . Smoking status: Never Smoker  . Smokeless tobacco: Never Used  Vaping Use  . Vaping Use: Never used  Substance Use Topics  . Alcohol use: No  . Drug use: No     Allergies   Doxycycline   Review of Systems As per HPI   Physical Exam Triage Vital Signs ED Triage Vitals   Enc Vitals Group     BP 05/06/20 1625 115/82     Pulse Rate 05/06/20 1625 86     Resp 05/06/20 1625 18     Temp 05/06/20 1625 (!) 97.4 F (36.3 C)     Temp Source 05/06/20 1625 Oral     SpO2 05/06/20 1625 97 %     Weight --      Height --      Head Circumference --      Peak Flow --      Pain Score 05/06/20 1626 0     Pain Loc --      Pain Edu? --      Excl. in GC? --    No data found.  Updated Vital Signs BP 115/82 (BP Location: Left Arm)   Pulse 86   Temp (!) 97.4 F (36.3 C) (Oral)   Resp 18   SpO2 97%   Visual Acuity Right Eye Distance:   Left Eye Distance:   Bilateral Distance:    Right Eye Near:   Left Eye Near:    Bilateral Near:     Physical Exam Constitutional:      General: She is not in acute distress.    Appearance: She is not ill-appearing or diaphoretic.  HENT:     Head: Normocephalic and atraumatic.     Mouth/Throat:     Mouth: Mucous membranes are moist.     Pharynx: Oropharynx is clear. No oropharyngeal exudate or posterior oropharyngeal erythema.  Eyes:     General: No scleral icterus.    Conjunctiva/sclera: Conjunctivae normal.     Pupils: Pupils are equal, round, and reactive to light.  Neck:     Comments: Trachea midline, negative JVD Cardiovascular:     Rate and Rhythm: Normal rate and regular rhythm.     Heart sounds: No murmur heard.  No gallop.   Pulmonary:     Effort: Pulmonary effort is normal. No respiratory distress.     Breath sounds: No wheezing, rhonchi or rales.  Musculoskeletal:     Cervical back: Neck supple. No tenderness.  Lymphadenopathy:     Cervical: No cervical adenopathy.  Skin:    Capillary Refill: Capillary refill takes less than 2 seconds.     Coloration: Skin is not jaundiced or pale.     Findings: No rash.  Neurological:     General: No focal deficit present.     Mental Status: She is alert and oriented to person, place, and time.      UC Treatments / Results  Labs (all labs ordered are listed,  but only abnormal results are displayed) Labs Reviewed -  No data to display  EKG   Radiology No results found.  Procedures Procedures (including critical care time)  Medications Ordered in UC Medications - No data to display  Initial Impression / Assessment and Plan / UC Course  I have reviewed the triage vital signs and the nursing notes.  Pertinent labs & imaging results that were available during my care of the patient were reviewed by me and considered in my medical decision making (see chart for details).     Patient afebrile, nontoxic, with SpO2 97%.  We will treat supportively as outlined below.  Return precautions discussed, patient verbalized understanding and is agreeable to plan. Final Clinical Impressions(s) / UC Diagnoses   Final diagnoses:  Cough     Discharge Instructions     Tessalon for cough. Start flonase, atrovent nasal spray for nasal congestion/drainage. You can use over the counter nasal saline rinse such as neti pot for nasal congestion. Keep hydrated, your urine should be clear to pale yellow in color. Tylenol/motrin for fever and pain. Monitor for any worsening of symptoms, chest pain, shortness of breath, wheezing, swelling of the throat, go to the emergency department for further evaluation needed.     ED Prescriptions    Medication Sig Dispense Auth. Provider   benzonatate (TESSALON) 100 MG capsule Take 1 capsule (100 mg total) by mouth every 8 (eight) hours. 21 capsule Hall-Potvin, [place], PA-C     PDMP not reviewed this encounter.   Hall-Potvin, [place], [place] 05/06/20 1719

## 2020-05-06 NOTE — ED Triage Notes (Signed)
Guardian states pt had a negative covid test on Saturday and was prescribed amoxicillin and continues to have worsening symptoms. Pt is aox4 and ambulatory.

## 2020-05-20 ENCOUNTER — Telehealth (INDEPENDENT_AMBULATORY_CARE_PROVIDER_SITE_OTHER): Payer: Medicare Other | Admitting: Adult Health

## 2020-05-20 ENCOUNTER — Encounter: Payer: Self-pay | Admitting: Adult Health

## 2020-05-20 DIAGNOSIS — R059 Cough, unspecified: Secondary | ICD-10-CM | POA: Diagnosis not present

## 2020-05-20 MED ORDER — PREDNISONE 20 MG PO TABS: 20.0000 mg | ORAL_TABLET | Freq: Every day | ORAL | 0 refills | Status: DC

## 2020-05-20 NOTE — Progress Notes (Signed)
Virtual Visit via Video Note  I connected with Ashley [name] on 05/20/20 at  3:30 PM EDT by a video enabled telemedicine application and verified that I am speaking with the correct person using two identifiers.  Location patient: home Location provider:work or home office Persons participating in the virtual visit: patient, provider  I discussed the limitations of evaluation and management by telemedicine and the availability of in person appointments. The patient expressed understanding and agreed to proceed.   HPI: 29 year old female is being evaluated today for cough.  She was seen originally on 05/02/2020 at the urgent care for sinus congestion and cough and prescribed amoxicillin and promethazine-DM syrup.  Her  sinus issues resolved but unfortunately continued to have a dry cough.  She was then seen again 4 days later in urgent care for the complaint of a cough and prescribed Tessalon Perles and Atrovent nasal spray as well as advised to start Flonase.  He reports that despite all these measures as well as over-the-counter Mucinex she continues to have a dry cough that is worse at night.  She denies fevers, chills, shortness of breath, or wheezing.  She has been tested for Covid which was negative.   ROS: See pertinent positives and negatives per HPI.  Past Medical History:  Diagnosis Date  . ADHD (attention deficit hyperactivity disorder)   . Mood disorder (HCC)   . Seizures (HCC)    childhood    Past Surgical History:  Procedure Laterality Date  . TONSILLECTOMY AND ADENOIDECTOMY      Family History  Problem Relation Age of Onset  . Diabetes Mother   . Asthma Sister   . Stroke Maternal Uncle        Current Outpatient Medications:  .  benzonatate (TESSALON) 100 MG capsule, Take 1 capsule (100 mg total) by mouth every 8 (eight) hours., Disp: 21 capsule, Rfl: 0 .  ciclopirox (PENLAC) 8 % solution, Apply topically at bedtime. Apply over nail and surrounding skin. Apply  daily over previous coat. After seven (7) days, may remove with alcohol and continue cycle., Disp: 6.6 mL, Rfl: 4 .  Levonorgestrel-Ethinyl Estradiol (AMETHIA) 0.15-0.03 &0.01 MG tablet, Take 1 tablet by mouth daily., Disp: 91 tablet, Rfl: 4 .  Levonorgestrel-Ethinyl Estradiol (SEASONIQUE) 0.15-0.03 &0.01 MG tablet, Take 1 tablet by mouth daily., Disp: , Rfl:  .  mometasone (NASONEX) 50 MCG/ACT nasal spray, Use 1-2 sprays in each nostril once daily for stuffy nose or drainage., Disp: 17 g, Rfl: 3 .  olopatadine (PATANOL) 0.1 % ophthalmic solution, Can use one drop in each eye twice a day as needed., Disp: 5 mL, Rfl: 5 .  Olopatadine HCl (PATANASE) 0.6 % SOLN, Place 2 sprays into the nose daily., Disp: 30.5 g, Rfl: 11 .  paliperidone (INVEGA) 6 MG 24 hr tablet, Take 6 mg by mouth every morning., Disp: , Rfl:  .  polyethylene glycol powder (GLYCOLAX/MIRALAX) powder, Take 1/2 capful (8 grams) daily dissolved in at least 8 ounces water/juice, Disp: 527 g, Rfl: 1 .  promethazine-dextromethorphan (PROMETHAZINE-DM) 6.25-15 MG/5ML syrup, Take 5 mLs by mouth 4 (four) times daily as needed for cough., Disp: 100 mL, Rfl: 0 .  ranitidine (ZANTAC) 150 MG tablet, Take 1 tablet (150 mg total) by mouth 2 (two) times daily., Disp: 60 tablet, Rfl: 3  EXAM:  VITALS per patient if applicable:  GENERAL: alert, oriented, appears well and in no acute distress  HEENT: atraumatic, conjunttiva clear, no obvious abnormalities on inspection of external nose and ears  NECK: normal movements of the head and neck  LUNGS: on inspection no signs of respiratory distress, breathing rate appears normal, no obvious gross SOB, gasping or wheezing  CV: no obvious cyanosis  MS: moves all visible extremities without noticeable abnormality  PSYCH/NEURO: pleasant and cooperative, no obvious depression or anxiety, speech and thought processing grossly intact  ASSESSMENT AND PLAN:  Discussed the following assessment and  plan:  1. Cough -Multiple failures of medication to help get rid of her cough.  Would like her to use humidifier at the bedroom but this would be added expense.  We will send in prednisone 20 mg tabs to take for 5 days that we can hopefully get rid of this cough for her.  She was advised to follow-up with myself or her PCP if no improvement after the prednisone therapy has completed. - predniSONE (DELTASONE) 20 MG tablet; Take 1 tablet (20 mg total) by mouth daily with breakfast.  Dispense: 5 tablet; Refill: 0     I discussed the assessment and treatment plan with the patient. The patient was provided an opportunity to ask questions and all were answered. The patient agreed with the plan and demonstrated an understanding of the instructions.   The patient was advised to call back or seek an in-person evaluation if the symptoms worsen or if the condition fails to improve as anticipated.   Shirline Frees, NP

## 2020-06-05 DIAGNOSIS — H04123 Dry eye syndrome of bilateral lacrimal glands: Secondary | ICD-10-CM | POA: Diagnosis not present

## 2020-06-05 DIAGNOSIS — H5213 Myopia, bilateral: Secondary | ICD-10-CM | POA: Diagnosis not present

## 2020-06-15 ENCOUNTER — Other Ambulatory Visit: Payer: Self-pay

## 2020-06-15 ENCOUNTER — Ambulatory Visit (INDEPENDENT_AMBULATORY_CARE_PROVIDER_SITE_OTHER): Payer: Medicare Other | Admitting: Podiatry

## 2020-06-15 DIAGNOSIS — B351 Tinea unguium: Secondary | ICD-10-CM

## 2020-06-17 NOTE — Progress Notes (Signed)
Subjective: 29 year old female presents the office today for follow evaluation of nail fungus.  She been using topical Penlac and she states that she is seeing much improvement with this.  Denies any pain in the nails and the redness or drainage or signs of infection.  She has no other concerns today. Denies any systemic complaints such as fevers, chills, nausea, vomiting. No acute changes since last appointment, and no other complaints at this time.   Objective: AAO x3, NAD DP/PT pulses palpable bilaterally, CRT less than 3 seconds Overall nails are much clear.  Some discoloration present distal aspect the nails.  There is no edema, erythema or any signs of infection noted to the nails.  No open lesions. No pain with calf compression, swelling, warmth, erythema  Assessment: Onychomycosis, currently on Penlac  Plan: -All treatment options discussed with the patient including all alternatives, risks, complications.  -Doing much better with Penlac.  Continue current use.  Discussed nail trimming techniques.  I did not debride the nails today. -Patient encouraged to call the office with any questions, concerns, change in symptoms.   Vivi Barrack DPM

## 2020-06-23 ENCOUNTER — Telehealth (INDEPENDENT_AMBULATORY_CARE_PROVIDER_SITE_OTHER): Payer: Medicare Other | Admitting: Family Medicine

## 2020-06-23 ENCOUNTER — Other Ambulatory Visit: Payer: Self-pay

## 2020-06-23 DIAGNOSIS — R0981 Nasal congestion: Secondary | ICD-10-CM | POA: Diagnosis not present

## 2020-06-23 DIAGNOSIS — R059 Cough, unspecified: Secondary | ICD-10-CM

## 2020-06-23 MED ORDER — MOMETASONE FUROATE 50 MCG/ACT NA SUSP: NASAL | 3 refills | Status: DC

## 2020-06-23 NOTE — Progress Notes (Signed)
Virtual Visit via Video Note  I connected with Ashley [name]  on 06/23/20 at  5:00 PM EST by a video enabled telemedicine application and verified that I am speaking with the correct person using two identifiers.  Location patient: home, [place] Location provider:work or home office Persons participating in the virtual visit: patient, provider  I discussed the limitations of evaluation and management by telemedicine and the availability of in person appointments. The patient expressed understanding and agreed to proceed.   HPI:  Acute telemedicine visit for sinus issues: -Onset: 5 days ago -Symptoms include: nasal congestion, cough -Denies: fevers, SOB, wheezing, CP, diarrhea, vomiting, body aches, malaise -grandmother has been sick this past week too; no known flu or [phone]  -Pertinent past medical history: Allergies, Nasonex is on her medication list, but is not using currently -Pertinent medication allergies: Doxycycline -COVID-19 vaccine status: fully vaccinated -has had not had flu shot  ROS: See pertinent positives and negatives per HPI.  Past Medical History:  Diagnosis Date  . ADHD (attention deficit hyperactivity disorder)   . Mood disorder (HCC)   . Seizures (HCC)    childhood    Past Surgical History:  Procedure Laterality Date  . TONSILLECTOMY AND ADENOIDECTOMY       Current Outpatient Medications:  .  ciclopirox (PENLAC) 8 % solution, Apply topically at bedtime. Apply over nail and surrounding skin. Apply daily over previous coat. After seven (7) days, may remove with alcohol and continue cycle., Disp: 6.6 mL, Rfl: 4 .  Levonorgestrel-Ethinyl Estradiol (AMETHIA) 0.15-0.03 &0.01 MG tablet, Take 1 tablet by mouth daily., Disp: 91 tablet, Rfl: 4 .  Levonorgestrel-Ethinyl Estradiol (SEASONIQUE) 0.15-0.03 &0.01 MG tablet, Take 1 tablet by mouth daily., Disp: , Rfl:  .  mometasone (NASONEX) 50 MCG/ACT nasal spray, Use 1-2 sprays in each nostril once daily for stuffy nose or  drainage., Disp: 17 g, Rfl: 3 .  olopatadine (PATANOL) 0.1 % ophthalmic solution, Can use one drop in each eye twice a day as needed., Disp: 5 mL, Rfl: 5 .  Olopatadine HCl (PATANASE) 0.6 % SOLN, Place 2 sprays into the nose daily., Disp: 30.5 g, Rfl: 11 .  paliperidone (INVEGA) 6 MG 24 hr tablet, Take 6 mg by mouth every morning., Disp: , Rfl:  .  polyethylene glycol powder (GLYCOLAX/MIRALAX) powder, Take 1/2 capful (8 grams) daily dissolved in at least 8 ounces water/juice, Disp: 527 g, Rfl: 1 .  predniSONE (DELTASONE) 20 MG tablet, Take 1 tablet (20 mg total) by mouth daily with breakfast., Disp: 5 tablet, Rfl: 0 .  ranitidine (ZANTAC) 150 MG tablet, Take 1 tablet (150 mg total) by mouth 2 (two) times daily., Disp: 60 tablet, Rfl: 3  EXAM:  VITALS per patient if applicable:  GENERAL: alert, oriented, appears well and in no acute distress  HEENT: atraumatic, conjunttiva clear, no obvious abnormalities on inspection of external nose and ears  NECK: normal movements of the head and neck  LUNGS: on inspection no signs of respiratory distress, breathing rate appears normal, no obvious gross SOB, gasping or wheezing  CV: no obvious cyanosis  MS: moves all visible extremities without noticeable abnormality  PSYCH/NEURO: pleasant and cooperative, no obvious depression or anxiety, speech and thought processing grossly intact  ASSESSMENT AND PLAN:  Discussed the following assessment and plan:  Nasal congestion  Cough  -we discussed possible serious and likely etiologies, options for evaluation and workup, limitations of telemedicine visit vs in person visit, treatment, treatment risks and precautions. Pt prefers to treat via telemedicine  empirically rather than in person at this moment.  Suspect possible seasonal allergies, viral upper respiratory illness versus other.  Opted for treatment with restarting the Nasonex, nasal saline, discussed over-the-counter options for cough including  Mucinex or Delsym -other symptomatic care instructions provided in her patient instructions.  Discussed possibility of breakthrough Covid, testing options, treatment, potential complications and staying home while sick. Work/School slipped offered: declined Scheduled follow up with PCP offered: Agrees to follow-up if needed. Advised to seek prompt in person care if worsening, new symptoms arise, or if is not improving with treatment.    I discussed the assessment and treatment plan with the patient. The patient was provided an opportunity to ask questions and all were answered. The patient agreed with the plan and demonstrated an understanding of the instructions.     Terressa Koyanagi, DO

## 2020-06-23 NOTE — Patient Instructions (Signed)
   It was nice to meet you today, and I really hope you are feeling better soon. I help [facility] out with telemedicine visits on Tuesdays and Thursdays and am available for visits on those days. If you have any concerns or questions following this visit please schedule a follow up visit with your Primary Care doctor or seek care at a local urgent care clinic to avoid delays in care.    Seek in person care promptly if your symptoms worsen, new concerns arise or you are not improving with treatment. Call 911 and/or seek emergency care if you symptoms are severe or life threatening.   -stay home while sick, and if you have COVID19 please stay home for a full 10 days since the onset of symptoms PLUS one day of no fever and feeling better  -[place] COVID19 testing information: [email or web address] [place] [phone] Most pharmacies also offer COVID-19 testing.  -I sent the medication(s) we discussed to your pharmacy: Meds ordered this encounter  Medications  . mometasone (NASONEX) 50 MCG/ACT nasal spray    Sig: Use 1-2 sprays in each nostril once daily for stuffy nose or drainage.    Dispense:  17 g    Refill:  3    -can use tylenol or aleve if needed for fevers, aches and pains per instructions  -Can try Mucinex or Delsym for the cough  -can use nasal saline a few times per day if nasal congestion  -stay hydrated, drink plenty of fluids and eat small healthy meals - avoid dairy  -follow up with your doctor in 2-3 days unless improving and feeling better

## 2020-07-29 ENCOUNTER — Ambulatory Visit: Payer: Medicare Other

## 2020-08-05 ENCOUNTER — Ambulatory Visit (INDEPENDENT_AMBULATORY_CARE_PROVIDER_SITE_OTHER): Payer: Medicare Other

## 2020-08-05 VITALS — BP 118/70 | HR 74 | Temp 98.0°F | Ht 65.0 in | Wt 217.2 lb

## 2020-08-05 DIAGNOSIS — Z23 Encounter for immunization: Secondary | ICD-10-CM

## 2020-08-05 DIAGNOSIS — Z0000 Encounter for general adult medical examination without abnormal findings: Secondary | ICD-10-CM | POA: Diagnosis not present

## 2020-08-05 NOTE — Progress Notes (Addendum)
Subjective:   Ashley [name] is a 29 y.o. female who presents for Medicare Annual (Subsequent) preventive examination.  Review of Systems    No ROS. Medicare Wellness Visit. Additional risk factors are reflected in social history. Cardiac Risk Factors include: family history of premature cardiovascular disease     Objective:    Today's Vitals   08/05/20 1551  Pulse: 74  Temp: 98 F (36.7 C)  SpO2: 99%  Weight: 217 lb 3.2 oz (98.5 kg)  Height: 5\' 5"  (1.651 m)  PainSc: 0-No pain   Body mass index is 36.14 kg/m.  Advanced Directives 08/05/2020 05/02/2020 03/18/2018 05/13/2015  Does Patient Have a Medical Advance Directive? No No No No  Would patient like information on creating a medical advance directive? No - Patient declined No - Patient declined No - Patient declined No - patient declined information    Current Medications (verified) Outpatient Encounter Medications as of 08/05/2020  Medication Sig   ciclopirox (PENLAC) 8 % solution Apply topically at bedtime. Apply over nail and surrounding skin. Apply daily over previous coat. After seven (7) days, may remove with alcohol and continue cycle.   Levonorgestrel-Ethinyl Estradiol (AMETHIA) 0.15-0.03 &0.01 MG tablet Take 1 tablet by mouth daily.   Levonorgestrel-Ethinyl Estradiol (SEASONIQUE) 0.15-0.03 &0.01 MG tablet Take 1 tablet by mouth daily.   mometasone (NASONEX) 50 MCG/ACT nasal spray Use 1-2 sprays in each nostril once daily for stuffy nose or drainage.   olopatadine (PATANOL) 0.1 % ophthalmic solution Can use one drop in each eye twice a day as needed.   Olopatadine HCl (PATANASE) 0.6 % SOLN Place 2 sprays into the nose daily.   paliperidone (INVEGA) 6 MG 24 hr tablet Take 6 mg by mouth every morning.   polyethylene glycol powder (GLYCOLAX/MIRALAX) powder Take 1/2 capful (8 grams) daily dissolved in at least 8 ounces water/juice   predniSONE (DELTASONE) 20 MG tablet Take 1 tablet (20 mg total) by mouth daily with  breakfast.   ranitidine (ZANTAC) 150 MG tablet Take 1 tablet (150 mg total) by mouth 2 (two) times daily.   No facility-administered encounter medications on file as of 08/05/2020.    Allergies (verified) Doxycycline   History: Past Medical History:  Diagnosis Date   ADHD (attention deficit hyperactivity disorder)    Mood disorder (HCC)    Seizures (HCC)    childhood   Past Surgical History:  Procedure Laterality Date   TONSILLECTOMY AND ADENOIDECTOMY     Family History  Problem Relation Age of Onset   Diabetes Mother    Asthma Sister    Stroke Maternal Uncle    Social History   Socioeconomic History   Marital status: Single    Spouse name: Not on file   Number of children: 0   Years of education: Not on file   Highest education level: Not on file  Occupational History   Not on file  Tobacco Use   Smoking status: Never Smoker   Smokeless tobacco: Never Used  Vaping Use   Vaping Use: Never used  Substance and Sexual Activity   Alcohol use: No   Drug use: No   Sexual activity: Never    Birth control/protection: Pill  Other Topics Concern   Not on file  Social History Narrative   Lives with mom (Ashley [name]) and sister (Ashley [name]) both whoa re patients here.   Mom is her guardian, unable to live alone due to mental disability.   Social Determinants of Health  Financial Resource Strain: Low Risk    Difficulty of Paying Living Expenses: Not hard at all  Food Insecurity: No Food Insecurity   Worried About Programme researcher, broadcasting/film/video in the Last Year: Never true   Ran Out of Food in the Last Year: Never true  Transportation Needs: No Transportation Needs   Lack of Transportation (Medical): No   Lack of Transportation (Non-Medical): No  Physical Activity: Sufficiently Active   Days of Exercise per Week: 5 days   Minutes of Exercise per Session: 30 min  Stress: No Stress Concern Present   Feeling of Stress : Not at all  Social Connections: Moderately  Integrated   Frequency of Communication with Friends and Family: More than three times a week   Frequency of Social Gatherings with Friends and Family: More than three times a week   Attends Religious Services: More than 4 times per year   Active Member of [employer] or Organizations: No   Attends Engineer, structural: More than 4 times per year   Marital Status: Never married    Tobacco Counseling Counseling given: Not Answered   Clinical Intake:  Pre-visit preparation completed: Yes  Pain : No/denies pain Pain Score: 0-No pain     BMI - recorded: 36.14 Nutritional Status: BMI > 30  Obese Nutritional Risks: None Diabetes: No  How often do you need to have someone help you when you read instructions, pamphlets, or other written materials from your doctor or pharmacy?: 1 - Never What is the last grade level you completed in school?: High School Graduate  Diabetic? NO  Interpreter Needed?: No  Information entered by :: Ashley Garde N. Aireona Torelli, LPN   Activities of Daily Living In your present state of health, do you have any difficulty performing the following activities: 08/05/2020  Hearing? N  Vision? N  Difficulty concentrating or making decisions? N  Walking or climbing stairs? N  Dressing or bathing? N  Doing errands, shopping? Y  Preparing Food and eating ? N  Using the Toilet? N  In the past six months, have you accidently leaked urine? N  Do you have problems with loss of bowel control? N  Managing your Medications? N  Managing your Finances? Y  Housekeeping or managing your Housekeeping? N  Some recent data might be hidden    Patient Care Team: Myrlene Broker, MD as PCP - General (Internal Medicine) Osborn Coho, MD as Attending Physician (Otolaryngology) Baxter Hire, MD (Inactive) as Attending Physician (Allergy) Antionette Char, MD as Attending Physician (Obstetrics and Gynecology) Mateo Flow, MD as Consulting Physician  (Ophthalmology)  Indicate any recent Medical Services you may have received from other than [facility] providers in the past year (date may be approximate).     Assessment:   This is a routine wellness examination for Ashley [name].  Hearing/Vision screen No exam data present  Dietary issues and exercise activities discussed: Current Exercise Habits: Home exercise routine, Type of exercise: walking, Time (Minutes): 30, Frequency (Times/Week): 5, Weekly Exercise (Minutes/Week): 150, Intensity: Moderate, Exercise limited by: None identified  Goals       Patient Stated (pt-stated)      I would like to start traveling again.       Depression Screen PHQ 2/9 Scores 08/05/2020 08/01/2019 07/25/2018 05/13/2015  PHQ - 2 Score 0 0 0 0    Fall Risk Fall Risk  08/05/2020 05/13/2015  Falls in the past year? 0 No  Number falls in past yr: 0 -  Injury  with Fall? 0 -  Risk for fall due to : No Fall Risks -  Follow up Falls evaluation completed -    FALL RISK PREVENTION PERTAINING TO THE HOME:  Any stairs in or around the home? No  If so, are there any without handrails? No  Home free of loose throw rugs in walkways, pet beds, electrical cords, etc? Yes  Adequate lighting in your home to reduce risk of falls? Yes   ASSISTIVE DEVICES UTILIZED TO PREVENT FALLS:  Life alert? No  Use of a cane, walker or w/c? No  Grab bars in the bathroom? Yes  Shower chair or bench in shower? Yes  Elevated toilet seat or a handicapped toilet? Yes   TIMED UP AND GO:  Was the test performed? No .  Length of time to ambulate 10 feet: 0 sec.   Gait steady and fast without use of assistive device  Cognitive Function: No flowsheet data found. Normal cognitive status assessed by direct observation by this Nurse Health Advisor. No abnormalities found.          Immunizations Immunization History  Administered Date(s) Administered   Influenza,inj,Quad PF,6+ Mos 05/09/2019   PFIZER SARS-COV-2 Vaccination  12/30/2019, 01/20/2020   Tdap 03/18/2018    TDAP status: Up to date  Flu Vaccine status: Up to date  Pneumococcal vaccine status: Declined,  Education has been provided regarding the importance of this vaccine but patient still declined. Advised may receive this vaccine at local pharmacy or Health Dept. Aware to provide a copy of the vaccination record if obtained from local pharmacy or Health Dept. Verbalized acceptance and understanding.   Covid-19 vaccine status: Completed vaccines  Qualifies for Shingles Vaccine? No   Zostavax completed No   Shingrix Completed?: No.    Education has been provided regarding the importance of this vaccine. Patient has been advised to call insurance company to determine out of pocket expense if they have not yet received this vaccine. Advised may also receive vaccine at local pharmacy or Health Dept. Verbalized acceptance and understanding.  Screening Tests Health Maintenance  Topic Date Due   Hepatitis C Screening  Never done   INFLUENZA VACCINE  03/08/2020   PAP-Cervical Cytology Screening  02/07/2021   PAP SMEAR-Modifier  02/07/2021   TETANUS/TDAP  03/18/2028   COVID-19 Vaccine  Completed   HIV Screening  Completed    Health Maintenance  Health Maintenance Due  Topic Date Due   Hepatitis C Screening  Never done   INFLUENZA VACCINE  03/08/2020    COLORECTAL SCREENING: NEVER DONE DUE TO AGE   MAMMOGRAM SCREENING: NEVER DONE DUE TO AGE  BONE DENSITY: NEVER DONE DUE TO AGE  Lung Cancer Screening: (Low Dose CT Chest recommended if Age 30-80 years, 30 pack-year currently smoking OR have quit w/in 15years.) does not qualify.   Lung Cancer Screening Referral: NO  Additional Screening:  Hepatitis C Screening: does qualify; Completed NO  Vision Screening: Recommended annual ophthalmology exams for early detection of glaucoma and other disorders of the eye. Is the patient up to date with their annual eye exam?  Yes  Who is the provider or  what is the name of the office in which the patient attends annual eye exams? [facility] If pt is not established with a provider, would they like to be referred to a provider to establish care? No .   Dental Screening: Recommended annual dental exams for proper oral hygiene  Community Resource Referral / Chronic Care Management:  CRR required this visit?  No   CCM required this visit?  No      Plan:     I have personally reviewed and noted the following in the patient's chart:   Medical and social history Use of alcohol, tobacco or illicit drugs  Current medications and supplements Functional ability and status Nutritional status Physical activity Advanced directives List of other physicians Hospitalizations, surgeries, and ER visits in previous 12 months Vitals Screenings to include cognitive, depression, and falls Referrals and appointments  In addition, I have reviewed and discussed with patient certain preventive protocols, quality metrics, and best practice recommendations. A written personalized care plan for preventive services as well as general preventive health recommendations were provided to patient.     Mickeal Needy, LPN   [id number]   Nurse Notes: n/a   Medical screening examination/treatment/procedure(s) were performed by non-physician practitioner and as supervising physician I was immediately available for consultation/collaboration.  I agree with above. Jacinta Shoe, MD

## 2020-10-07 ENCOUNTER — Other Ambulatory Visit: Payer: Self-pay

## 2020-10-07 ENCOUNTER — Encounter: Payer: Self-pay | Admitting: Women's Health

## 2020-10-07 ENCOUNTER — Ambulatory Visit (INDEPENDENT_AMBULATORY_CARE_PROVIDER_SITE_OTHER): Payer: Medicare Other | Admitting: Women's Health

## 2020-10-07 VITALS — BP 126/79 | HR 97 | Wt 215.8 lb

## 2020-10-07 DIAGNOSIS — N898 Other specified noninflammatory disorders of vagina: Secondary | ICD-10-CM | POA: Diagnosis not present

## 2020-10-07 NOTE — Patient Instructions (Signed)
Vaginitis  Vaginitis is a condition in which the vaginal tissue swells and becomes irritated. This condition is most often caused by a change in the normal balance of bacteria and yeast that live in the vagina. This change causes an overgrowth of certain bacteria or yeast, which causes the inflammation. There are different types of vaginitis. What are the causes? The cause of this condition depends on the type of vaginitis. It can be caused by:  Bacteria (bacterial vaginosis).  Yeast, which is a fungus (candidiasis).  A parasite (trichomoniasis vaginitis).  A virus (viral vaginitis).  Low hormone levels (atrophic vaginitis). Low hormone levels can occur during pregnancy, breastfeeding, or after menopause.  Irritants, such as bubble baths, scented tampons, and feminine sprays (allergic vaginitis). Other factors can change the normal balance of the yeast and bacteria that live in the vagina. These include:  Antibiotic medicines.  Poor hygiene.  Diaphragms, vaginal sponges, spermicides, birth control pills, and intrauterine devices (IUDs).  Sex.  Infection.  Uncontrolled diabetes.  A weakened body defense system (immune system). What increases the risk? This condition is more likely to develop in women who:  Smoke or are exposed to secondhand smoke.  Use vaginal douches, scented tampons, or scented sanitary pads.  Wear tight-fitting pants or thong underwear.  Use oral birth control pills or an IUD.  Have sex without a condom or have multiple partners.  Have an STI.  Frequently use the spermicide nonoxynol-9.  Eat lots of foods high in sugar or who have uncontrolled diabetes.  Have low estrogen levels.  Have a weakened immune system from an immune disorder or medical treatment.  Are pregnant or breastfeeding. What are the signs or symptoms? Symptoms vary depending on the cause of the vaginitis. Common symptoms include:  Abnormal vaginal discharge. ? The  discharge is white, gray, or yellow with bacterial vaginosis. ? The discharge is thick, white, and cheesy with a yeast infection. ? The discharge is frothy and yellow or greenish with trichomoniasis.  A bad vaginal smell. The smell is fishy with bacterial vaginosis.  Vaginal itching, pain, or swelling.  Pain with sex.  Pain or burning when urinating. Sometimes there are no symptoms. How is this diagnosed? This condition is diagnosed based on your symptoms and medical history. A physical exam, including a pelvic exam, will also be done. You may also have other tests, including:  Tests to determine the pH level (acidity or alkalinity) of your vagina.  A whiff test to assess the odor that results when a sample of your vaginal discharge is mixed with a potassium hydroxide solution.  Tests of vaginal fluid. A sample will be examined under a microscope. How is this treated? Treatment varies depending on the type of vaginitis you have. Your treatment may include:  Antibiotic creams or pills to treat bacterial vaginosis and trichomoniasis.  Antifungal medicines, such as vaginal creams or suppositories, to treat a yeast infection.  Medicine to ease discomfort if you have viral vaginitis. Your sexual partner should also be treated.  Estrogen delivered in a cream, pill, suppository, or vaginal ring to treat atrophic vaginitis. If vaginal dryness occurs, lubricants and moisturizing creams may help. You may need to avoid scented soaps, sprays, or douches.  Stopping use of a product that is causing allergic vaginitis and then using a vaginal cream to treat the symptoms. Follow these instructions at home: Lifestyle  Keep your genital area clean and dry. Avoid soap, and only rinse the area with water.  Do not douche   or use tampons until your health care provider says it is okay. Use sanitary pads, if needed.  Do not have sex until your health care provider approves. When you can return to sex,  practice safe sex and use condoms.  Wipe from front to back. This avoids the spread of bacteria from the rectum to the vagina. General instructions  Take over-the-counter and prescription medicines only as told by your health care provider.  If you were prescribed an antibiotic medicine, take or use it as told by your health care provider. Do not stop taking or using the antibiotic even if you start to feel better.  Keep all follow-up visits. This is important. How is this prevented?  Use mild, unscented products. Do not use things that can irritate the vagina, such as fabric softeners. Avoid the following products if they are scented: ? Feminine sprays. ? Detergents. ? Tampons. ? Feminine hygiene products. ? Soaps or bubble baths.  Let air reach your genital area. To do this: ? Wear cotton underwear to reduce moisture buildup. ? Avoid wearing underwear while you sleep. ? Avoid wearing tight pants and underwear or nylons without a cotton panel. ? Avoid wearing thong underwear.  Take off any wet clothing, such as bathing suits, as soon as possible.  Practice safe sex and use condoms. Contact a health care provider if:  You have abdominal or pelvic pain.  You have a fever or chills.  You have symptoms that last for more than 2-3 days. Get help right away if:  You have a fever and your symptoms suddenly get worse. Summary  Vaginitis is a condition in which the vaginal tissue becomes inflamed.This condition is most often caused by a change in the normal balance of bacteria and yeast that live in the vagina.  Treatment varies depending on the type of vaginitis you have.  Do not douche, use tampons, or have sex until your health care provider approves. When you can return to sex, practice safe sex and use condoms. This information is not intended to replace advice given to you by your health care provider. Make sure you discuss any questions you have with your health care  provider. Document Revised: 01/23/2020 Document Reviewed: 01/23/2020 Elsevier Patient Education  2021 [employer]  

## 2020-10-07 NOTE — Progress Notes (Signed)
  History:  [name] is a 30 y.o. G0P0000 who presents to clinic today for vaginal bumps and vaginal irritation. Patient reports she had some external itching about two weeks ago when she switched body soaps from [employer] to [employer] Antibacterial. She is back to using the sensitive skin soap and not experiencing any issues.  Patient also reports bumps on her inner thigh and one on her vagina that come and go. Patient reports they are pus filled and can be painful.  Patient's sister is her legal guardian and was present for the visit with the exception of the physical exam. RN chaperoned the exam as per clinic protocol.  The following portions of the patient's history were reviewed and updated as appropriate: allergies, current medications, family history, past medical history, social history, past surgical history and problem list.  Review of Systems:  Review of Systems  Genitourinary:       +vaginal itching +bumps in vagina and inner thigh     Objective:  Physical Exam BP 126/79   Pulse 97   Wt 215 lb 12.8 oz (97.9 kg)   BMI 35.91 kg/m  Physical Exam Vitals and nursing note reviewed. Exam conducted with a chaperone present.  Constitutional:      Appearance: Normal appearance.  HENT:     Head: Normocephalic and atraumatic.  Pulmonary:     Effort: Pulmonary effort is normal.  Genitourinary:    Pubic Area: No rash.      Labia:        Right: No rash, tenderness or lesion.        Left: No rash, tenderness or lesion.        Comments: Two clogged pores present on inner thigh, one mucus-filled cyst present in vestibule without s/sx of infection. Skin:    General: Skin is warm and dry.  Neurological:     Mental Status: She is alert and oriented to person, place, and time.    Labs and Imaging No results found for this or any previous visit (from the past 24 hour(s)).  No results found.   Assessment & Plan:  1. Vaginal irritation -discussed vulvovaginal  hygeine and advised not to use antibacterial soap -no itching present at this time, no treatment indicated, normal external exam -bumps appear to be clogged pores on inner thigh and in vestibule, no areas of concern, discussed warm compresses for relief, no s/sx of infection -pt to return if symptoms return/worsen  Approximately 7 minutes of total time was spent with this patient on exam and counseling.  Marylen Ponto, NP 10/07/2020 4:10 PM

## 2020-10-07 NOTE — Progress Notes (Signed)
Patient is in the office, with guardian (sister), complaining of vaginal irritation. Currently on BC pills, reports that she has a menstrual cycle every 3 months. Pt reports vaginal itching, and painful bumps, denies discharge and odor.

## 2020-12-13 ENCOUNTER — Other Ambulatory Visit: Payer: Self-pay

## 2020-12-13 ENCOUNTER — Ambulatory Visit (HOSPITAL_COMMUNITY)
Admission: EM | Admit: 2020-12-13 | Discharge: 2020-12-13 | Disposition: A | Discharge status: Home / Self Care | Payer: Medicare Other | Attending: Sports Medicine | Admitting: Sports Medicine

## 2020-12-13 ENCOUNTER — Encounter (HOSPITAL_COMMUNITY): Payer: Self-pay

## 2020-12-13 DIAGNOSIS — L03012 Cellulitis of left finger: Secondary | ICD-10-CM

## 2020-12-13 DIAGNOSIS — M7989 Other specified soft tissue disorders: Secondary | ICD-10-CM | POA: Diagnosis not present

## 2020-12-13 DIAGNOSIS — M79645 Pain in left finger(s): Secondary | ICD-10-CM

## 2020-12-13 MED ORDER — CEPHALEXIN 250 MG/5ML PO SUSR: 500.0000 mg | Freq: Four times a day (QID) | ORAL | 0 refills | Status: AC

## 2020-12-13 NOTE — ED Provider Notes (Signed)
[facility]    CSN: [id number] Arrival date & time: 12/13/20  1742      History   Chief Complaint Chief Complaint  Patient presents with  . Hand Pain    HPI Ashley [name] is a 30 y.o. female.   Patient is a pleasant 30 year old female who is right-hand dominant who presents with her guardian for evaluation of pain and swelling at the cuticle and distal aspect of the left index finger.  She lives in a group home.  She reports having discomfort around that area for 3 weeks now.  No accidents trauma or falls.  She says that it is getting worse.  She only informed her guardian today and they come into the urgent care for evaluation and management.  No fever shakes chills.  No red flag signs or symptoms elicited on history.       Past Medical History:  Diagnosis Date  . ADHD (attention deficit hyperactivity disorder)   . Mood disorder (HCC)   . Seizures (HCC)    childhood    Patient Active Problem List   Diagnosis Date Noted  . Onychomycosis 03/16/2020  . Abscess 08/01/2019  . Routine general medical examination at a health care facility 07/26/2018  . Allergic rhinitis 07/24/2017  . Dysmenorrhea 01/25/2013  . Obesity (BMI 35.0-39.9 without comorbidity) 12/21/2011  . Mood disorder (HCC) 12/21/2011  . Constipation 12/21/2011  . ADD (attention deficit disorder) 02/19/2010    Past Surgical History:  Procedure Laterality Date  . TONSILLECTOMY AND ADENOIDECTOMY      OB History    Gravida  0   Para  0   Term  0   Preterm  0   AB  0   Living  0     SAB  0   IAB  0   Ectopic  0   Multiple  0   Live Births               Home Medications    Prior to Admission medications   Medication Sig Start Date End Date Taking? Authorizing Provider  cephALEXin (KEFLEX) 250 MG/5ML suspension Take 10 mLs (500 mg total) by mouth 4 (four) times daily for 7 days. 12/13/20 12/20/20 Yes Delton See, MD  ciclopirox [employer]) 8 % solution Apply topically  at bedtime. Apply over nail and surrounding skin. Apply daily over previous coat. After seven (7) days, may remove with alcohol and continue cycle. 03/09/20   Vivi Barrack, DPM  Levonorgestrel-Ethinyl Estradiol (AMETHIA) 0.15-0.03 &0.01 MG tablet Take 1 tablet by mouth daily. 03/20/20   Nugent, Odie Sera, NP  Levonorgestrel-Ethinyl Estradiol (AMETHIA) 0.15-0.03 &0.01 MG tablet Take 1 tablet by mouth daily. Patient not taking: Reported on 10/07/2020    [provider]  mometasone (NASONEX) 50 MCG/ACT nasal spray Use 1-2 sprays in each nostril once daily for stuffy nose or drainage. 06/23/20   Terressa Koyanagi, DO  olopatadine (PATANOL) 0.1 % ophthalmic solution Can use one drop in each eye twice a day as needed. 09/13/16   Kozlow, Alvira Philips, MD  Olopatadine HCl (PATANASE) 0.6 % SOLN Place 2 sprays into the nose daily. 01/23/18   Myrlene Broker, MD  paliperidone (INVEGA) 6 MG 24 hr tablet Take 6 mg by mouth every morning. 03/06/20   [provider]  polyethylene glycol powder (GLYCOLAX/MIRALAX) powder Take 1/2 capful (8 grams) daily dissolved in at least 8 ounces water/juice Patient not taking: Reported on 10/07/2020 09/11/17   Beverley Fiedler,  MD  predniSONE (DELTASONE) 20 MG tablet Take 1 tablet (20 mg total) by mouth daily with breakfast. Patient not taking: Reported on 10/07/2020 05/20/20   Shirline FreesNafziger, Cory, NP  ranitidine (ZANTAC) 150 MG tablet Take 1 tablet (150 mg total) by mouth 2 (two) times daily. Patient not taking: Reported on 10/07/2020 09/11/17   Pyrtle, Carie CaddyJay M, MD    Family History Family History  Problem Relation Age of Onset  . Diabetes Mother   . Asthma Sister   . Stroke Maternal Uncle     Social History Social History   Tobacco Use  . Smoking status: Never Smoker  . Smokeless tobacco: Never Used  Vaping Use  . Vaping Use: Never used  Substance Use Topics  . Alcohol use: No  . Drug use: No     Allergies   Doxycycline   Review of Systems Review of Systems   Constitutional: Negative.  Negative for chills, diaphoresis, fatigue and fever.  HENT: Negative.  Negative for congestion.   Eyes: Negative.  Negative for pain.  Respiratory: Negative.  Negative for shortness of breath.   Cardiovascular: Negative.  Negative for chest pain and palpitations.  Gastrointestinal: Negative.  Negative for abdominal pain.  Genitourinary: Negative.  Negative for dysuria.  Musculoskeletal: Positive for arthralgias. Negative for back pain, joint swelling and myalgias.  Skin: Negative for color change, pallor, rash and wound.  Neurological: Negative for dizziness, weakness, numbness and headaches.  Hematological: Negative.  Negative for adenopathy. Does not bruise/bleed easily.  All other systems reviewed and are negative.    Physical Exam Triage Vital Signs ED Triage Vitals [12/13/20 1759]  Enc Vitals Group     BP      Pulse      Resp      Temp      Temp src      SpO2      Weight      Height      Head Circumference      Peak Flow      Pain Score 10     Pain Loc      Pain Edu?      Excl. in GC?    No data found.  Updated Vital Signs BP 130/86 (BP Location: Right Arm)   Pulse 90   Temp 98.5 F (36.9 C) (Oral)   Resp 19   LMP 11/29/2020 (Exact Date)   SpO2 99%   Visual Acuity Right Eye Distance:   Left Eye Distance:   Bilateral Distance:    Right Eye Near:   Left Eye Near:    Bilateral Near:     Physical Exam Vitals and nursing note reviewed.  Constitutional:      General: She is not in acute distress.    Appearance: Normal appearance. She is not ill-appearing, toxic-appearing or diaphoretic.  HENT:     Head: Normocephalic and atraumatic.     Nose: Nose normal.     Mouth/Throat:     Mouth: Mucous membranes are moist.  Eyes:     Extraocular Movements: Extraocular movements intact.     Pupils: Pupils are equal, round, and reactive to light.  Cardiovascular:     Rate and Rhythm: Normal rate and regular rhythm.     Pulses: Normal  pulses.     Heart sounds: Normal heart sounds. No murmur heard. No friction rub. No gallop.   Pulmonary:     Effort: Pulmonary effort is normal. No respiratory distress.     Breath sounds:  Normal breath sounds. No stridor. No wheezing, rhonchi or rales.  Musculoskeletal:     Right hand: Normal.     Left hand: Swelling and tenderness present. No deformity, lacerations or bony tenderness. Normal range of motion. Normal strength. Normal sensation. There is no disruption of two-point discrimination. Normal capillary refill. Normal pulse.     Cervical back: Normal range of motion and neck supple. No rigidity or tenderness.     Comments: Paronychia of the left index finger with soft tissue swelling around the cuticle.  Very TTP.  No warmth or abscess appreciated.  Lymphadenopathy:     Cervical: No cervical adenopathy.  Skin:    General: Skin is warm and dry.     Capillary Refill: Capillary refill takes less than 2 seconds.     Findings: No bruising, erythema, lesion or rash.  Neurological:     General: No focal deficit present.     Mental Status: She is alert and oriented to person, place, and time.      UC Treatments / Results  Labs (all labs ordered are listed, but only abnormal results are displayed) Labs Reviewed - No data to display  EKG   Radiology No results found.  Procedures Procedures (including critical care time)  Medications Ordered in UC Medications - No data to display  Initial Impression / Assessment and Plan / UC Course  I have reviewed the triage vital signs and the nursing notes.  Pertinent labs & imaging results that were available during my care of the patient were reviewed by me and considered in my medical decision making (see chart for details).  Clinical impression: 30 year old female presenting with left index finger pain around the cuticle is consistent with a paronychia.  She also has some swelling.  Treatment plan: 1.  The findings and  treatment plan were discussed in detail with the patient and her caregiver.  All parties were in agreement voiced verbal understanding.  All questions were encouraged and answered. 2.  She does have an allergy to doxycycline.  We will put her on Keflex.  Prescription was sent to her pharmacy. 3.  Educational handouts were provided. 4.  Supportive care, over-the-counter meds as needed, warm compresses.  I have asked him not to open up the wound.  Just allow the body to heal it from the inside. 5.  Follow-up with primary care physician next week to ensure that she is going in the right direction. 6.  If she develops any fever, worsening redness or swelling, intractable pain, could be a sign of a systemic infection and she should go to the ER.  They voiced verbal understanding. 7.  Discharge from care at this time in stable condition.  She will follow-up here as needed.    Final Clinical Impressions(s) / UC Diagnoses   Final diagnoses:  Finger pain, left  Swelling of left index finger  Paronychia of left index finger     Discharge Instructions     You have a mild infection to your left index finger also called a paronychia.  I have prescribed an antibiotic. Please see the educational handouts. Please contact your primary care provider for recheck.  Sometimes this needs to be opened and drained.  It is not ready to do so today. If you develop any significant fever or warmth around that joint or concern about significant infection, and cannot see your primary care provider then please go to the ER.    ED Prescriptions    Medication Sig  Dispense Auth. Provider   cephALEXin (KEFLEX) 250 MG/5ML suspension Take 10 mLs (500 mg total) by mouth 4 (four) times daily for 7 days. 280 mL Delton See, MD     PDMP not reviewed this encounter.   Delton See, MD 12/17/20 [phone]

## 2020-12-13 NOTE — ED Triage Notes (Signed)
Pt presents with pain to the left middle finger x 3 weeks. She states it hurts to touch it. She states she might have an ingrown nail.

## 2020-12-13 NOTE — Discharge Instructions (Signed)
You have a mild infection to your left index finger also called a paronychia.  I have prescribed an antibiotic. Please see the educational handouts. Please contact your primary care provider for recheck.  Sometimes this needs to be opened and drained.  It is not ready to do so today. If you develop any significant fever or warmth around that joint or concern about significant infection, and cannot see your primary care provider then please go to the ER.

## 2020-12-25 ENCOUNTER — Encounter (HOSPITAL_BASED_OUTPATIENT_CLINIC_OR_DEPARTMENT_OTHER): Payer: Medicare Other | Attending: Internal Medicine | Admitting: Internal Medicine

## 2020-12-25 ENCOUNTER — Other Ambulatory Visit: Payer: Self-pay

## 2020-12-25 DIAGNOSIS — L03012 Cellulitis of left finger: Secondary | ICD-10-CM | POA: Diagnosis not present

## 2020-12-25 DIAGNOSIS — Z881 Allergy status to other antibiotic agents status: Secondary | ICD-10-CM | POA: Diagnosis not present

## 2020-12-25 NOTE — Progress Notes (Signed)
Ashley [name] ([id number]) , Visit Report for 12/25/2020 Abuse/Suicide Risk Screen Details Patient Name: Date of Service: Ashley [name] 12/25/2020 1:15 PM Medical Record Number: [id number] Patient Account Number: [id number] Date of Birth/Sex: Treating RN: 29-Nov-1990 (29 y.o. Ardis Rowan, Lauren Primary Care Lillie Portner: [name] Other Clinician: Referring Naiyah Klostermann: Treating Juergen Hardenbrook/Extender: Leandrew Koyanagi in Treatment: 0 Abuse/Suicide Risk Screen Items Answer ABUSE RISK SCREEN: Has anyone close to you tried to hurt or harm you recentlyo No Do you feel uncomfortable with anyone in your familyo No Has anyone forced you do things that you didnt want to doo No Electronic Signature(s) Signed: 12/25/2020 5:22:38 PM By: Fonnie Mu RN Entered By: Fonnie Mu on 12/25/2020 13:42:41 -------------------------------------------------------------------------------- Activities of Daily Living Details Patient Name: Date of Service: Ashley [name], Ashley NCY L. 12/25/2020 1:15 PM Medical Record Number: [id number] Patient Account Number: [id number] Date of Birth/Sex: Treating RN: 1990-11-27 (29 y.o. Ardis Rowan, Lauren Primary Care Ann-Marie Kluge: [name] Other Clinician: Referring Mariana Goytia: Treating Abisola Carrero/Extender: Leandrew Koyanagi in Treatment: 0 Activities of Daily Living Items Answer Activities of Daily Living (Please select one for each item) Drive Automobile Not Able T Medications ake Completely Able Use T elephone Completely Able Care for Appearance Completely Able Use T oilet Completely Able Bath / Shower Completely Able Dress Self Completely Able Feed Self Completely Able Walk Completely Able Get In / Out Bed Completely Able Housework Completely Able Prepare Meals Completely Able Handle Money Completely Able Shop for Self Completely Able Electronic Signature(s) Signed: 12/25/2020 5:22:38 PM By:  Fonnie Mu RN Entered By: Fonnie Mu on 12/25/2020 13:43:21 -------------------------------------------------------------------------------- Education Screening Details Patient Name: Date of Service: Ashley [name], Ashley NCY L. 12/25/2020 1:15 PM Medical Record Number: [id number] Patient Account Number: [id number] Date of Birth/Sex: Treating RN: Jul 30, 1991 (29 y.o. Ardis Rowan, Lauren Primary Care Liliauna Santoni: [name] Other Clinician: Referring Rachal Dvorsky: Treating Yi Falletta/Extender: Leandrew Koyanagi in Treatment: 0 Primary Learner Assessed: Patient Learning Preferences/Education Level/Primary Language Learning Preference: Explanation, Demonstration, Communication Board, Printed Material Highest Education Level: High School Preferred Language: English Cognitive Barrier Language Barrier: No Translator Needed: No Memory Deficit: No Emotional Barrier: No Cultural/Religious Beliefs Affecting Medical Care: No Physical Barrier Impaired Vision: No Impaired Hearing: No Decreased Hand dexterity: No Knowledge/Comprehension Knowledge Level: High Comprehension Level: High Ability to understand written instructions: High Ability to understand verbal instructions: High Motivation Anxiety Level: Calm Cooperation: Cooperative Education Importance: Denies Need Interest in Health Problems: Asks Questions Perception: Coherent Willingness to Engage in Self-Management High Activities: Readiness to Engage in Self-Management High Activities: Electronic Signature(s) Signed: 12/25/2020 5:22:38 PM By: Fonnie Mu RN Entered By: Fonnie Mu on 12/25/2020 13:43:58 -------------------------------------------------------------------------------- Fall Risk Assessment Details Patient Name: Date of Service: Ashley [name], Ashley NCY L. 12/25/2020 1:15 PM Medical Record Number: [id number] Patient Account Number: [id number] Date of Birth/Sex: Treating  RN: [birth year] (29 y.o. Ardis Rowan, Lauren Primary Care Myrka Sylva: [name] Other Clinician: Referring Gwendlyn Hanback: Treating Taleen Prosser/Extender: Leandrew Koyanagi in Treatment: 0 Fall Risk Assessment Items Have you had 2 or more falls in the last 12 monthso 0 No Have you had any fall that resulted in injury in the last 12 monthso 0 No FALLS RISK SCREEN History of falling - immediate or within 3 months 0 No Secondary diagnosis (Do you have 2 or more medical diagnoseso) 0 No Ambulatory aid None/bed rest/wheelchair/nurse 0 No Crutches/cane/walker 0 No Furniture 0 No Intravenous therapy Access/Saline/Heparin Lock 0 No Gait/Transferring Normal/ bed rest/ wheelchair 0 No  Weak (short steps with or without shuffle, stooped but able to lift head while walking, may seek 0 No support from furniture) Impaired (short steps with shuffle, may have difficulty arising from chair, head down, impaired 0 No balance) Mental Status Oriented to own ability 0 No Electronic Signature(s) Signed: 12/25/2020 5:22:38 PM By: Fonnie Mu RN Entered By: Fonnie Mu on 12/25/2020 13:48:04 -------------------------------------------------------------------------------- Foot Assessment Details Patient Name: Date of Service: Ashley [name], Ashley NCY L. 12/25/2020 1:15 PM Medical Record Number: [id number] Patient Account Number: [id number] Date of Birth/Sex: Treating RN: [birth year] (29 y.o. Ardis Rowan, Lauren Primary Care Zev Blue: [name] Other Clinician: Referring Delrick Dehart: Treating Colton Tassin/Extender: Leandrew Koyanagi in Treatment: 0 Foot Assessment Items Site Locations + = Sensation present, - = Sensation absent, C = Callus, U = Ulcer R = Redness, W = Warmth, M = Maceration, PU = Pre-ulcerative lesion F = Fissure, S = Swelling, D = Dryness Assessment Right: Left: Other Deformity: No No Prior Foot Ulcer: No No Prior Amputation:  No No Charcot Joint: No No Ambulatory Status: Gait: Notes n/a and pt. no diabetic Electronic Signature(s) Signed: 12/25/2020 5:22:38 PM By: Fonnie Mu RN Entered By: Fonnie Mu on 12/25/2020 13:48:22 -------------------------------------------------------------------------------- Nutrition Risk Screening Details Patient Name: Date of Service: Ashley [name], Ashley NCY L. 12/25/2020 1:15 PM Medical Record Number: [id number] Patient Account Number: [id number] Date of Birth/Sex: Treating RN: [birth year] (29 y.o. Ardis Rowan, Lauren Primary Care Othman Masur: [name] Other Clinician: Referring Ranald Alessio: Treating Archita Lomeli/Extender: Leandrew Koyanagi in Treatment: 0 Height (in): Weight (lbs): Body Mass Index (BMI): Nutrition Risk Screening Items Score Screening NUTRITION RISK SCREEN: I have an illness or condition that made me change the kind and/or amount of food I eat 0 No I eat fewer than two meals per day 0 No I eat few fruits and vegetables, or milk products 0 No I have three or more drinks of beer, liquor or wine almost every day 0 No I have tooth or mouth problems that make it hard for me to eat 0 No I don't always have enough money to buy the food I need 0 No I eat alone most of the time 0 No I take three or more different prescribed or over-the-counter drugs a day 0 No Without wanting to, I have lost or gained 10 pounds in the last six months 0 No I am not always physically able to shop, cook and/or feed myself 0 No Nutrition Protocols Good Risk Protocol 0 No interventions needed Moderate Risk Protocol High Risk Proctocol Risk Level: Good Risk Score: 0 Electronic Signature(s) Signed: 12/25/2020 5:22:38 PM By: Fonnie Mu RN Entered By: Fonnie Mu on 12/25/2020 13:48:09

## 2020-12-25 NOTE — Progress Notes (Signed)
[name] ([id number]) , Visit Report for 12/25/2020 Allergy List Details Patient Name: Date of Service: Ashley [name] 12/25/2020 1:15 PM Medical Record Number: [id number] Patient Account Number: [id number] Date of Birth/Sex: Treating RN: 06-Jan-1991 (30 y.o. Ashley [name], Ashley [name] Cova Knieriem: [name] Other Clinician: Referring Tillman Kazmierski: Treating Lillieann Pavlich/Extender: Leandrew Koyanagi in Treatment: 0 Allergies Active Allergies doxycycline Reaction: rash Severity: Moderate Allergy Notes Electronic Signature(s) Signed: 12/25/2020 5:22:38 PM By: Fonnie Mu RN Entered By: Fonnie Mu on 12/25/2020 13:47:57 -------------------------------------------------------------------------------- Arrival Information Details Patient Name: Date of Service: [name], [name] 12/25/2020 1:15 PM Medical Record Number: [id number] Patient Account Number: [id number] Date of Birth/Sex: Treating RN: [birth year] (29 y.o. Ashley [name], Ashley [name] Araceli Arango: [name] Other Clinician: Referring Rana Hochstein: Treating Ainsley Deakins/Extender: Leandrew Koyanagi in Treatment: 0 Visit Information Patient Arrived: Ambulatory Arrival Time: 13:33 Accompanied By: legal guardian Transfer Assistance: None Patient Identification Verified: Yes Secondary Verification Process Completed: Yes Patient Requires Transmission-Based Precautions: No Patient Has Alerts: No History Since Last Visit Added or deleted any medications: No Any new allergies or adverse reactions: No Had a fall or experienced change in activities of daily living that may affect risk of falls: No Signs or symptoms of abuse/neglect since last visito No Hospitalized since last visit: No Implantable device outside of the clinic excluding cellular tissue based products placed in the center since last visit: No Electronic Signature(s) Signed: 12/25/2020  5:22:38 PM By: Fonnie Mu RN Entered By: Fonnie Mu on 12/25/2020 13:40:52 -------------------------------------------------------------------------------- Clinic Level of [name] Assessment Details Patient Name: Date of Service: [name], [name] 12/25/2020 1:15 PM Medical Record Number: [id number] Patient Account Number: [id number] Date of Birth/Sex: Treating RN: [birth year] (29 y.o. Ashley [name] Primary [name] Tiaja Hagan: [name] Other Clinician: Referring Tiandra Swoveland: Treating Aariel Ems/Extender: Leandrew Koyanagi in Treatment: 0 Clinic Level of [name] Assessment Items TOOL 2 Quantity Score X- 1 0 Use when only an EandM is performed on the INITIAL visit ASSESSMENTS - Nursing Assessment / Reassessment X- 1 20 General Physical Exam (combine w/ comprehensive assessment (listed just below) when performed on new pt. evals) X- 1 25 Comprehensive Assessment (HX, ROS, Risk Assessments, Wounds Hx, etc.) ASSESSMENTS - Wound and Skin A ssessment / Reassessment X - Simple Wound Assessment / Reassessment - one wound 1 5 []  - 0 Complex Wound Assessment / Reassessment - multiple wounds []  - 0 Dermatologic / Skin Assessment (not related to wound area) ASSESSMENTS - Ostomy and/or Continence Assessment and [name] []  - 0 Incontinence Assessment and Management []  - 0 Ostomy [name] Assessment and Management (repouching, etc.) PROCESS - Coordination of [name] []  - 0 Simple Patient / Family Education for ongoing [name] X- 1 20 Complex (extensive) Patient / Family Education for ongoing [name] X- 1 10 Staff obtains , Records, T Results / Process Orders est []  - 0 Staff telephones HHA, Nursing Homes / Clarify orders / etc []  - 0 Routine Transfer to another Facility (non-emergent condition) []  - 0 Routine Hospital Admission (non-emergent condition) []  - 0 New Admissions / / Ordering NPWT Apligraf, etc. , []  - 0 Emergency  Hospital Admission (emergent condition) []  - 0 Simple Discharge Coordination []  - 0 Complex (extensive) Discharge Coordination PROCESS - Special Needs []  - 0 Pediatric / Minor Patient Management []  - 0 Isolation Patient Management []  - 0 Hearing / Language / Visual special needs []  - 0 Assessment of Community assistance (transportation, D/C planning, etc.) []  -  0 Additional assistance / Altered mentation []  - 0 Support Surface(s) Assessment (bed, cushion, seat, etc.) INTERVENTIONS - Wound Cleansing / Measurement []  - 0 Wound Imaging (photographs - any number of wounds) []  - 0 Wound Tracing (instead of photographs) []  - 0 Simple Wound Measurement - one wound []  - 0 Complex Wound Measurement - multiple wounds []  - 0 Simple Wound Cleansing - one wound []  - 0 Complex Wound Cleansing - multiple wounds INTERVENTIONS - Wound Dressings X - Small Wound Dressing one or multiple wounds 1 10 []  - 0 Medium Wound Dressing one or multiple wounds []  - 0 Large Wound Dressing one or multiple wounds []  - 0 Application of Medications - injection INTERVENTIONS - Miscellaneous []  - 0 External ear exam []  - 0 Specimen Collection (cultures, biopsies, blood, body fluids, etc.) []  - 0 Specimen(s) / Culture(s) sent or taken to Lab for analysis []  - 0 Patient Transfer (multiple staff / / Similar devices) []  - 0 Simple Staple / Suture removal (25 or less) []  - 0 Complex Staple / Suture removal (26 or more) []  - 0 Hypo / Hyperglycemic Management (close monitor of Blood Glucose) []  - 0 Ankle / Brachial Index (ABI) - do not check if billed separately Has the patient been seen at the hospital within the last three years: Yes Total Score: 90 Level Of [name]: New/Established - Level 3 Electronic Signature(s) Signed: 12/25/2020 5:20:22 PM By: Entered By: on 12/25/2020  14:34:53 -------------------------------------------------------------------------------- Encounter Discharge Information Details Patient Name: Date of Service: [name], [name] 12/25/2020 1:15 PM Medical Record Number: Patient Account Number: Date of Birth/Sex: Treating RN: 08-27-1990 (29 y.o. Primary [name] Latravion Graves: Other Clinician: Referring Stephannie Broner: Treating Cole Eastridge/Extender: in Treatment: 0 Encounter Discharge Information Items Discharge Condition: Stable Ambulatory Status: Ambulatory Discharge Destination: Home Schedule Follow-up Appointment: Yes Clinical Summary of [name]: Provided on 12/25/2020 Form Type Recipient Paper Patient Patient Electronic Signature(s) Signed: 12/25/2020 5:20:22 PM By: Nurse, adult Entered By: on 12/25/2020 14:36:02 -------------------------------------------------------------------------------- Lower Extremity Assessment Details Patient Name: Date of Service: [name], [name] 12/25/2020 1:15 PM Medical Record Number: Patient Account Number: 12/27/2020 Date of Birth/Sex: Treating RN: Apr 30, 1991 (29 y.o. Antonieta Iba, Ashley [name] Aviance [place]: 12/27/2020 Other Clinician: Referring Anniece Bleiler: Treating Darell Saputo/Extender: 12/27/2020 in Treatment: 0 Electronic Signature(s) Signed: 12/25/2020 5:22:38 PM By: [id number] RN Entered By: 05/11/1991 on 12/25/2020 13:48:27 -------------------------------------------------------------------------------- Multi Wound Chart Details Patient Name: Date of Service: [name], [name] 12/25/2020 1:15 PM Medical Record Number: Leandrew Koyanagi Patient Account Number: 12/27/2020 Date of Birth/Sex: Treating RN: [birth year] (29 y.o. Antonieta Iba Primary [name] Krystalyn Kubota: Antonieta Iba Other Clinician: Referring  Deveron Shamoon: Treating Callin Ashe/Extender: 12/27/2020 in Treatment: 0 Vital Signs Height(in): Pulse(bpm): 74 Weight(lbs): Blood Pressure(mmHg): 112/74 Body Mass Index(BMI): Temperature(F): 98.7 Respiratory Rate(breaths/min): 17 Wound Assessments Treatment Notes Electronic Signature(s) Signed: 12/25/2020 3:43:54 PM By: [id number] DO Signed: 12/25/2020 5:20:22 PM By: Ashley [name] Entered By: [name] on 12/25/2020 14:50:39 -------------------------------------------------------------------------------- Pain Assessment Details Patient Name: Date of Service: [name], [name] 12/25/2020 1:15 PM Medical Record Number: Fonnie Mu Patient Account Number: Fonnie Mu Date of Birth/Sex: Treating RN: 1991/06/27 (29 y.o. 12/27/2020, Ashley [name] Samie Reasons: [id number] Other Clinician: Referring Nayquan Evinger: Treating Anthonette Lesage/Extender: [id number] in Treatment: 0 Active Problems Location of Pain Severity and Description of Pain Patient Has  Paino Yes Site Locations Pain Location: Pain Location: Pain in Ulcers Rate the pain. Current Pain Level: 3 Worst Pain Level: 10 Least Pain Level: 0 Tolerable Pain Level: 3 Pain Management and Medication Current Pain Management: Electronic Signature(s) Signed: 12/25/2020 5:22:38 PM By: Fonnie Mu RN Entered By: Fonnie Mu on 12/25/2020 13:44:34 -------------------------------------------------------------------------------- Patient/Caregiver Education Details Patient Name: Date of Service: [name], NA Janey Greaser 5/20/2022andnbsp1:15 PM Medical Record Number: [id number] Patient Account Number: [id number] Date of Birth/Gender: Treating RN: Nov 28, 1990 (30 y.o. Ashley [name] Primary [name] Physician: [name] Other Clinician: Referring Physician: Treating Physician/Extender: Leandrew Koyanagi in Treatment:  0 Education Assessment Education Provided To: Patient and Caregiver Education Topics Provided Notes Consult only, instructions given to apply neosporin or vaseline to area. Electronic Signature(s) Signed: 12/25/2020 5:20:22 PM By: Antonieta Iba Entered By: Antonieta Iba on 12/25/2020 14:30:33 -------------------------------------------------------------------------------- Vitals Details Patient Name: Date of Service: [name], [name] 12/25/2020 1:15 PM Medical Record Number: [id number] Patient Account Number: [id number] Date of Birth/Sex: Treating RN: 25-Apr-1991 (29 y.o. Ashley [name], Ashley [name] Elvin Banker: [name] Other Clinician: Referring Averil Digman: Treating Monai Hindes/Extender: Leandrew Koyanagi in Treatment: 0 Vital Signs Time Taken: 13:47 Temperature (F): 98.7 Pulse (bpm): 74 Respiratory Rate (breaths/min): 17 Blood Pressure (mmHg): 112/74 Reference Range: 80 - 120 mg / dl Electronic Signature(s) Signed: 12/25/2020 5:22:38 PM By: Fonnie Mu RN Entered By: Fonnie Mu on 12/25/2020 13:47:53

## 2020-12-25 NOTE — Progress Notes (Signed)
SUNDUS PETE ([id number]) , Visit Report for 12/25/2020 Chief Complaint Document Details Patient Name: Date of Service: Nicholaus Bloom 12/25/2020 1:15 PM Medical Record Number: [id number] Patient Account Number: [id number] Date of Birth/Sex: Treating RN: Jul 08, 1991 (30 y.o. [name] Primary Care Provider: [name] Other Clinician: Referring Provider: Treating Provider/Extender: Leandrew Koyanagi in Treatment: 0 Information Obtained from: Patient Chief Complaint Left index finger with history of paronychia Electronic Signature(s) Signed: 12/25/2020 3:43:54 PM By: Geralyn Corwin DO Entered By: Geralyn Corwin on 12/25/2020 14:51:51 -------------------------------------------------------------------------------- HPI Details Patient Name: Date of Service: [name], [name] 12/25/2020 1:15 PM Medical Record Number: [id number] Patient Account Number: [id number] Date of Birth/Sex: Treating RN: [birth year] (29 y.o. [name] Primary Care Provider: [name] Other Clinician: Referring Provider: Treating Provider/Extender: Leandrew Koyanagi in Treatment: 0 History of Present Illness HPI Description: Admission 5/20 Ms. Neliah Cuyler is a 30 year old female with a past medical history of ADD that presents to our clinic for a previously infected left index finger. She had visited urgent care on 5/8 and was prescribed amoxicillin for paronychia. She states that over the last 2 weeks the swelling, erythema and pain has subsided. She does have dried dead skin overlying the area that she would like removed. She currently denies signs of infection. Electronic Signature(s) Signed: 12/25/2020 3:43:54 PM By: Geralyn Corwin DO Entered By: Geralyn Corwin on 12/25/2020 14:51:55 -------------------------------------------------------------------------------- Physical Exam Details Patient Name: Date of  Service: [name], [name] 12/25/2020 1:15 PM Medical Record Number: [id number] Patient Account Number: [id number] Date of Birth/Sex: Treating RN: 10-Dec-1990 (29 y.o. [name] Primary Care Provider: [name] Other Clinician: Referring Provider: Treating Provider/Extender: Leandrew Koyanagi in Treatment: 0 Constitutional respirations regular, non-labored and within target range for patient.Marland Kitchen Psychiatric pleasant and cooperative. Notes Left index finger: There is nonviable tissue present. No signs of infection. Well-healing previous wound site with epithelialization Electronic Signature(s) Signed: 12/25/2020 3:43:54 PM By: Geralyn Corwin DO Entered By: Geralyn Corwin on 12/25/2020 14:52:02 -------------------------------------------------------------------------------- Physician Orders Details Patient Name: Date of Service: [name], [name] 12/25/2020 1:15 PM Medical Record Number: [id number] Patient Account Number: [id number] Date of Birth/Sex: Treating RN: 03-21-1991 (29 y.o. [name] Primary Care Provider: [name] Other Clinician: Referring Provider: Treating Provider/Extender: Leandrew Koyanagi in Treatment: 0 Verbal / Phone Orders: No Diagnosis Coding ICD-10 Coding Code Description L03.012 Cellulitis of left finger Follow-up Appointments Other: - Consult Only, no need for follow up. Additional Orders / Instructions Other: - Apply Neosporin or Vaseline and cover area with bandaid. Electronic Signature(s) Signed: 12/25/2020 3:43:54 PM By: Geralyn Corwin DO Entered By: Geralyn Corwin on 12/25/2020 15:06:13 -------------------------------------------------------------------------------- Problem List Details Patient Name: Date of Service: [name], [name] 12/25/2020 1:15 PM Medical Record Number: [id number] Patient Account Number: [id number] Date of Birth/Sex: Treating  RN: 09-02-1990 (29 y.o. [name] Primary Care Provider: [name] Other Clinician: Referring Provider: Treating Provider/Extender: Leandrew Koyanagi in Treatment: 0 Active Problems ICD-10 Encounter Code Description Active Date MDM Diagnosis L03.012 Cellulitis of left finger 12/25/2020 No Yes Inactive Problems Resolved Problems Electronic Signature(s) Signed: 12/25/2020 3:43:54 PM By: Geralyn Corwin DO Entered By: Geralyn Corwin on 12/25/2020 14:50:30 -------------------------------------------------------------------------------- Progress Note Details Patient Name: Date of Service: [name], [name] 12/25/2020 1:15 PM Medical Record Number: [id number] Patient Account Number: [id number] Date of Birth/Sex: Treating RN: 1990/08/16 (29 y.o. [name] Primary Care  Provider: [name] Other Clinician: Referring Provider: Treating Provider/Extender: Leandrew Koyanagi in Treatment: 0 Subjective Chief Complaint Information obtained from Patient Left index finger with history of paronychia History of Present Illness (HPI) Admission 5/20 Ms. Alaine Loughney is a 30 year old female with a past medical history of ADD that presents to our clinic for a previously infected left index finger. She had visited urgent care on 5/8 and was prescribed amoxicillin for paronychia. She states that over the last 2 weeks the swelling, erythema and pain has subsided. She does have dried dead skin overlying the area that she would like removed. She currently denies signs of infection. Patient History Information obtained from Patient. Allergies doxycycline (Severity: Moderate, Reaction: rash) Family History Diabetes - Maternal Grandparents, Hypertension - Maternal Grandparents,Paternal Grandparents, No family history of Cancer, Heart Disease, Hereditary Spherocytosis, Kidney Disease, Lung Disease, Seizures, Stroke,  Thyroid Problems, Tuberculosis. Social History Never smoker, Marital Status - Single, Alcohol Use - Never, Drug Use - No History, Caffeine Use - Moderate. Medical History Eyes Denies history of Cataracts, Glaucoma, Optic Neuritis Ear/Nose/Mouth/Throat Denies history of Chronic sinus problems/congestion, Middle ear problems Hematologic/Lymphatic Denies history of Anemia, Hemophilia, Human Immunodeficiency Virus, Lymphedema, Sickle Cell Disease Respiratory Denies history of Aspiration, Asthma, Chronic Obstructive Pulmonary Disease (COPD), Pneumothorax, Sleep Apnea, Tuberculosis Cardiovascular Denies history of Angina, Arrhythmia, Congestive Heart Failure, Coronary Artery Disease, Deep Vein Thrombosis, Hypertension, Hypotension, Myocardial Infarction, Peripheral Arterial Disease, Peripheral Venous Disease, Phlebitis, Vasculitis Gastrointestinal Denies history of Cirrhosis , Colitis, Crohnoos, Hepatitis A, Hepatitis B, Hepatitis C Endocrine Denies history of Type I Diabetes, Type II Diabetes Genitourinary Denies history of End Stage Renal Disease Immunological Denies history of Lupus Erythematosus, Raynaudoos, Scleroderma Integumentary (Skin) Denies history of History of Burn Musculoskeletal Denies history of Gout, Rheumatoid Arthritis, Osteoarthritis, Osteomyelitis Neurologic Denies history of Dementia, Neuropathy, Quadriplegia, Paraplegia, Seizure Disorder Oncologic Denies history of Received Chemotherapy, Received Radiation Psychiatric Denies history of Anorexia/bulimia, Confinement Anxiety Medical A Surgical History Notes nd Psychiatric Mood disorder, ADHD Review of Systems (ROS) Constitutional Symptoms (General Health) Denies complaints or symptoms of Fatigue, Fever, Chills, Marked Weight Change. Eyes Denies complaints or symptoms of Dry Eyes, Vision Changes, Glasses / Contacts. Respiratory Denies complaints or symptoms of Chronic or frequent coughs, Shortness of  Breath. Cardiovascular Denies complaints or symptoms of Chest pain. Gastrointestinal Denies complaints or symptoms of Frequent diarrhea, Nausea, Vomiting. Objective Constitutional respirations regular, non-labored and within target range for patient.. Vitals Time Taken: 1:47 PM, Temperature: 98.7 F, Pulse: 74 bpm, Respiratory Rate: 17 breaths/min, Blood Pressure: 112/74 mmHg. Psychiatric pleasant and cooperative. General Notes: Left index finger: There is nonviable tissue present. No signs of infection. Well-healing previous wound site with epithelialization Assessment Active Problems ICD-10 Cellulitis of left finger Patient had paronychia of the left index finger and was treated with antibiotics earlier this month. The infection has resolved. There was devitalized tissue that she wanted debrided and I did this in office. I recommended she put some antibiotic ointment or vaseline on the remaining devitalized tissue to soften this up. She can do this for the next week. Patient can follow-up as needed Plan Follow-up Appointments: Other: - Consult Only, no need for follow up. Additional Orders / Instructions: Other: - Apply Neosporin or Vaseline and cover area with bandaid. 1. In office sharp debridement 2. Follow-up as needed Electronic Signature(s) Signed: 12/25/2020 3:43:54 PM By: Geralyn Corwin DO Entered By: Geralyn Corwin on 12/25/2020 15:07:50 -------------------------------------------------------------------------------- HxROS Details Patient Name: Date of Service: [name], [name] 12/25/2020  1:15 PM Medical Record Number: [id number] Patient Account Number: [id number] Date of Birth/Sex: Treating RN: 1991/06/27 (29 y.o. Ardis Rowan, Lauren Primary Care Provider: [name] Other Clinician: Referring Provider: Treating Provider/Extender: Leandrew Koyanagi in Treatment: 0 Information Obtained From Patient Constitutional Symptoms  (General Health) Complaints and Symptoms: Negative for: Fatigue; Fever; Chills; Marked Weight Change Eyes Complaints and Symptoms: Negative for: Dry Eyes; Vision Changes; Glasses / Contacts Medical History: Negative for: Cataracts; Glaucoma; Optic Neuritis Respiratory Complaints and Symptoms: Negative for: Chronic or frequent coughs; Shortness of Breath Medical History: Negative for: Aspiration; Asthma; Chronic Obstructive Pulmonary Disease (COPD); Pneumothorax; Sleep Apnea; Tuberculosis Cardiovascular Complaints and Symptoms: Negative for: Chest pain Medical History: Negative for: Angina; Arrhythmia; Congestive Heart Failure; Coronary Artery Disease; Deep Vein Thrombosis; Hypertension; Hypotension; Myocardial Infarction; Peripheral Arterial Disease; Peripheral Venous Disease; Phlebitis; Vasculitis Gastrointestinal Complaints and Symptoms: Negative for: Frequent diarrhea; Nausea; Vomiting Medical History: Negative for: Cirrhosis ; Colitis; Crohns; Hepatitis A; Hepatitis B; Hepatitis C Ear/Nose/Mouth/Throat Medical History: Negative for: Chronic sinus problems/congestion; Middle ear problems Hematologic/Lymphatic Medical History: Negative for: Anemia; Hemophilia; Human Immunodeficiency Virus; Lymphedema; Sickle Cell Disease Endocrine Medical History: Negative for: Type I Diabetes; Type II Diabetes Genitourinary Medical History: Negative for: End Stage Renal Disease Immunological Medical History: Negative for: Lupus Erythematosus; Raynauds; Scleroderma Integumentary (Skin) Medical History: Negative for: History of Burn Musculoskeletal Medical History: Negative for: Gout; Rheumatoid Arthritis; Osteoarthritis; Osteomyelitis Neurologic Medical History: Negative for: Dementia; Neuropathy; Quadriplegia; Paraplegia; Seizure Disorder Oncologic Medical History: Negative for: Received Chemotherapy; Received Radiation Psychiatric Medical History: Negative for:  Anorexia/bulimia; Confinement Anxiety Past Medical History Notes: Mood disorder, ADHD Immunizations Pneumococcal Vaccine: Received Pneumococcal Vaccination: No Tetanus Vaccine: Last tetanus shot: 03/31/2018 Implantable Devices No devices added Family and Social History Cancer: No; Diabetes: Yes - Maternal Grandparents; Heart Disease: No; Hereditary Spherocytosis: No; Hypertension: Yes - Maternal Grandparents,Paternal Grandparents; Kidney Disease: No; Lung Disease: No; Seizures: No; Stroke: No; Thyroid Problems: No; Tuberculosis: No; Never smoker; Marital Status - Single; Alcohol Use: Never; Drug Use: No History; Caffeine Use: Moderate; Financial Concerns: No; Food, Clothing or Shelter Needs: No; Support System Lacking: No; Transportation Concerns: No Electronic Signature(s) Signed: 12/25/2020 3:43:54 PM By: Geralyn Corwin DO Signed: 12/25/2020 5:22:38 PM By: Fonnie Mu RN Entered By: Fonnie Mu on 12/25/2020 13:42:32 -------------------------------------------------------------------------------- SuperBill Details Patient Name: Date of Service: [name], [name] 12/25/2020 Medical Record Number: [id number] Patient Account Number: [id number] Date of Birth/Sex: Treating RN: February 13, 1991 (29 y.o. [name] Primary Care Provider: [name] Other Clinician: Referring Provider: Treating Provider/Extender: Leandrew Koyanagi in Treatment: 0 Diagnosis Coding ICD-10 Codes Code Description L03.012 Cellulitis of left finger Facility Procedures Physician Procedures : CPT4 Code Description Modifier [id number] WC PHYS LEVEL 3 NEW PT ICD-10 Diagnosis Description L03.012 Cellulitis of left finger Quantity: 1 Electronic Signature(s) Signed: 12/25/2020 3:43:54 PM By: Geralyn Corwin DO Entered By: Geralyn Corwin on 12/25/2020 15:10:47

## 2021-01-07 ENCOUNTER — Encounter: Payer: Medicare Other | Admitting: Internal Medicine

## 2021-02-01 ENCOUNTER — Telehealth: Payer: Self-pay | Admitting: Internal Medicine

## 2021-02-01 NOTE — Telephone Encounter (Signed)
Can do virtual visit if needed. Likely does not need antiviral due to age.

## 2021-02-01 NOTE — Telephone Encounter (Signed)
   Patient tested covid+ today Sister reports  patient is coughing and has sore throat Requesting medication be prescribed

## 2021-02-01 NOTE — Telephone Encounter (Signed)
Virtual visit needed? Please advise

## 2021-02-01 NOTE — Telephone Encounter (Signed)
Unable to get in contact with the patient to scheduled a virtual visit. LVM asking her to return my call to scheduled. Office number was provided.    If patient calls back please schedule a virtual visit with Dr. Okey Dupre.

## 2021-02-02 ENCOUNTER — Telehealth (INDEPENDENT_AMBULATORY_CARE_PROVIDER_SITE_OTHER): Payer: Medicare Other | Admitting: Family Medicine

## 2021-02-02 DIAGNOSIS — U071 COVID-19: Secondary | ICD-10-CM | POA: Diagnosis not present

## 2021-02-02 MED ORDER — BENZONATATE 200 MG PO CAPS: 200.0000 mg | ORAL_CAPSULE | Freq: Two times a day (BID) | ORAL | 0 refills | Status: DC | PRN

## 2021-02-02 NOTE — Patient Instructions (Signed)
  HOME CARE TIPS:  -I sent the medication(s) we discussed to your pharmacy: Meds ordered this encounter  Medications   benzonatate (TESSALON) 200 MG capsule    Sig: Take 1 capsule (200 mg total) by mouth 2 (two) times daily as needed for cough.    Dispense:  20 capsule    Refill:  0     -can use nasal saline a few times per day if you have nasal congestion; sometimes  a short course of Afrin nasal spray for 3 days can help with symptoms as well  -stay hydrated, drink plenty of fluids and eat small healthy meals - avoid dairy  -can take 1000 IU ( ) Vit D3 and 100-500 mg of Vit C daily per instructions  -follow up with your doctor in 2-3 days unless improving and feeling better  -stay home while sick, except to seek medical care. If you have COVID19, ideally it would be best to stay home for a full 10 days since the onset of symptoms PLUS one day of no fever and feeling better. Wear a good mask that fits snugly (such as N95 or KN95) if around others to reduce the risk of transmission.  It was nice to meet you today, and I really hope you are feeling better soon. I help [facility] out with telemedicine visits on Tuesdays and Thursdays and am available for visits on those days. If you have any concerns or questions following this visit please schedule a follow up visit with your Primary Care doctor or seek care at a local urgent care clinic to avoid delays in care.    Seek in person care or schedule a follow up video visit promptly if your symptoms worsen, new concerns arise or you are not improving with treatment. Call 911 and/or seek emergency care if your symptoms are severe or life threatening.

## 2021-02-02 NOTE — Progress Notes (Signed)
Virtual Visit via Telephone Note  I connected with Ashley [name] on 02/02/21 at  4:40 PM EDT by telephone and verified that I am speaking with the correct person using two identifiers.   I discussed the limitations, risks, security and privacy concerns of performing an evaluation and management service by telephone and the availability of in person appointments. I also discussed with the patient that there may be a patient responsible charge related to this service. The patient expressed understanding and agreed to proceed.  Location patient: home, [place] Location provider: work or home office Participants present for the call: patient, provider Patient did not have a visit with me in the prior 7 days to address this/these issue(s).   History of Present Illness:  Acute telemedicine visit for Covid19: -Onset: 9 days ago -Symptoms include: cough, pnd -Denies:CP, SOB, NVD, fever, inability to eat/drink/get out of bed -Has tried:OTC cough medications - musinex -Pertinent past medical history: see below -Pertinent medication allergies: Allergies  Allergen Reactions   Doxycycline Rash  -denies any chance of pregnancy, on OCP -COVID-19 vaccine status: vaccinate 1 pfizer/78moderna; no booster    Past Medical History:  Diagnosis Date   ADHD (attention deficit hyperactivity disorder)    Mood disorder (HCC)    Seizures (HCC)    childhood     Observations/Objective: Patient sounds cheerful and well on the phone. I do not appreciate any SOB. Speech and thought processing are grossly intact. Patient reported vitals:  Assessment and Plan:  COVID-19   Discussed treatment options, ideal treatment window, potential complications, isolation and precautions for COVID-19. She feels is improving except for the cough has persisted. After lengthy discussion, the patient opted nasal saline and Tessalon for cough.    Other symptomatic care measures summarized in patient  instructions.  Work/School slipped offered:declined Advised to seek prompt in person care if worsening, new symptoms arise, or if is not improving with treatment. Advised of options for inperson care in case PCP office not available. Did let the patient know that I only do telemedicine shifts for [facility] on Tuesdays and Thursdays and advised a follow up visit with PCP or at an [facility] if has further questions or concerns.   Follow Up Instructions:  I did not refer this patient for an OV with me in the next 24 hours for this/these issue(s).  I discussed the assessment and treatment plan with the patient. The patient was provided an opportunity to ask questions and all were answered. The patient agreed with the plan and demonstrated an understanding of the instructions.   I spent 11 minutes on the date of this visit in the care of this patient. See summary of tasks completed to properly care for this patient in the detailed notes above which also included counseling of above, review of PMH, medications, allergies, evaluation of the patient and ordering and/or  instructing patient on testing and care options.     Terressa Koyanagi, DO

## 2021-04-15 ENCOUNTER — Ambulatory Visit (INDEPENDENT_AMBULATORY_CARE_PROVIDER_SITE_OTHER): Payer: Medicare Other

## 2021-04-15 ENCOUNTER — Other Ambulatory Visit: Payer: Self-pay | Admitting: Internal Medicine

## 2021-04-15 DIAGNOSIS — M546 Pain in thoracic spine: Secondary | ICD-10-CM | POA: Diagnosis not present

## 2021-04-15 MED ORDER — FLUTICASONE PROPIONATE 50 MCG/ACT NA SUSP: 2.0000 | Freq: Every day | NASAL | 6 refills | Status: DC

## 2021-05-18 ENCOUNTER — Telehealth: Payer: Self-pay | Admitting: Women's Health

## 2021-05-18 DIAGNOSIS — Z01419 Encounter for gynecological examination (general) (routine) without abnormal findings: Secondary | ICD-10-CM

## 2021-05-18 MED ORDER — LEVONORGEST-ETH ESTRAD 91-DAY 0.15-0.03 &0.01 MG PO TABS: 1.0000 | ORAL_TABLET | Freq: Every day | ORAL | 0 refills | Status: DC

## 2021-05-18 NOTE — Telephone Encounter (Signed)
Aunt (guardian) called for refill on Timia's birth control.  Patient is due for her annual.  One refill routed to pharmacy.  Annual scheduled for November.

## 2021-06-10 ENCOUNTER — Other Ambulatory Visit: Payer: Self-pay

## 2021-06-10 ENCOUNTER — Encounter: Payer: Self-pay | Admitting: Internal Medicine

## 2021-06-10 ENCOUNTER — Ambulatory Visit (INDEPENDENT_AMBULATORY_CARE_PROVIDER_SITE_OTHER): Payer: Medicare Other | Admitting: Internal Medicine

## 2021-06-10 VITALS — BP 126/80 | HR 84 | Resp 18 | Ht 65.0 in | Wt 221.6 lb

## 2021-06-10 DIAGNOSIS — Z0000 Encounter for general adult medical examination without abnormal findings: Secondary | ICD-10-CM

## 2021-06-10 DIAGNOSIS — F39 Unspecified mood [affective] disorder: Secondary | ICD-10-CM

## 2021-06-10 DIAGNOSIS — Z23 Encounter for immunization: Secondary | ICD-10-CM | POA: Diagnosis not present

## 2021-06-10 DIAGNOSIS — E669 Obesity, unspecified: Secondary | ICD-10-CM

## 2021-06-10 NOTE — Progress Notes (Signed)
   Subjective:   Patient ID: Ashley [name], female    DOB: February 18, 1991, 30 y.o.   MRN: [id number]  HPI The patient is a 30 YO female coming in for physical.   PMH, FMH, social history reviewed and updated  Review of Systems  Constitutional: Negative.   HENT: Negative.    Eyes: Negative.   Respiratory:  Negative for cough, chest tightness and shortness of breath.   Cardiovascular:  Negative for chest pain, palpitations and leg swelling.  Gastrointestinal:  Negative for abdominal distention, abdominal pain, constipation, diarrhea, nausea and vomiting.  Musculoskeletal: Negative.   Skin: Negative.   Neurological: Negative.   Psychiatric/Behavioral: Negative.     Objective:  Physical Exam Constitutional:      Appearance: She is well-developed.  HENT:     Head: Normocephalic and atraumatic.  Cardiovascular:     Rate and Rhythm: Normal rate and regular rhythm.  Pulmonary:     Effort: Pulmonary effort is normal. No respiratory distress.     Breath sounds: Normal breath sounds. No wheezing or rales.  Abdominal:     General: Bowel sounds are normal. There is no distension.     Palpations: Abdomen is soft.     Tenderness: There is no abdominal tenderness. There is no rebound.  Musculoskeletal:     Cervical back: Normal range of motion.  Skin:    General: Skin is warm and dry.  Neurological:     Mental Status: She is alert and oriented to person, place, and time.     Coordination: Coordination normal.    Vitals:   06/10/21 1001  BP: 126/80  Pulse: 84  Resp: 18  SpO2: 99%  Weight: 221 lb 9.6 oz (100.5 kg)  Height: 5\' 5"  (1.651 m)    This visit occurred during the SARS-CoV-2 public health emergency.  Safety protocols were in place, including screening questions prior to the visit, additional usage of staff PPE, and extensive cleaning of exam room while observing appropriate contact time as indicated for disinfecting solutions.   Assessment & Plan:  Flu shot given at  visit

## 2021-06-11 NOTE — Assessment & Plan Note (Signed)
She is working on weight by exercise and diet.

## 2021-06-11 NOTE — Assessment & Plan Note (Signed)
Flu shot given today. Covid-19 booster counseled. Tetanus up to date. Pap smear up to date with gyn. Counseled about sun safety and mole surveillance. Counseled about the dangers of distracted driving. Given 10 year screening recommendations.

## 2021-06-11 NOTE — Assessment & Plan Note (Signed)
Stable and controlled. Sister is guardian and she is able to do all ADLs for self needs help on decision making.

## 2021-06-16 ENCOUNTER — Encounter: Payer: Self-pay | Admitting: Women's Health

## 2021-06-16 ENCOUNTER — Other Ambulatory Visit: Payer: Self-pay

## 2021-06-16 ENCOUNTER — Ambulatory Visit (INDEPENDENT_AMBULATORY_CARE_PROVIDER_SITE_OTHER): Payer: Medicare Other | Admitting: Women's Health

## 2021-06-16 ENCOUNTER — Other Ambulatory Visit (HOSPITAL_COMMUNITY)
Admission: RE | Admit: 2021-06-16 | Discharge: 2021-06-16 | Disposition: A | Discharge status: Home / Self Care | Payer: Medicare Other | Source: Ambulatory Visit | Attending: Women's Health | Admitting: Women's Health

## 2021-06-16 DIAGNOSIS — Z01419 Encounter for gynecological examination (general) (routine) without abnormal findings: Secondary | ICD-10-CM | POA: Diagnosis present

## 2021-06-16 MED ORDER — LEVONORGEST-ETH ESTRAD 91-DAY 0.15-0.03 &0.01 MG PO TABS: 1.0000 | ORAL_TABLET | Freq: Every day | ORAL | 4 refills | Status: DC

## 2021-06-16 NOTE — Progress Notes (Signed)
GYNECOLOGY ANNUAL PREVENTATIVE CARE ENCOUNTER NOTE  History:     Ashley [name] is a 30 y.o. G0P0000 female here for a routine annual gynecologic exam.  Current complaints: none.   Denies abnormal vaginal bleeding, discharge, pelvic pain, problems with intercourse or other gynecologic concerns. Pt declines STD testing today, pt reports she is not sexually active and sister confrism. Pt reports she does perform SBE. Pt denies bowel or bladder concerns. No family hx of breast, colon, endometrial or ovarian cancer. Pt does not smoke, drink or use drugs.  PMH: mood disorder Allergies: doxycycline PSH: none Meds: Invega  Sister present for interview portion, RN present for exam portion of visit.      Gynecologic History Patient's last menstrual period was 05/27/2021 (exact date). Menstruation: once every 3 months, 5 days, minimal bleeding, mild dysmenorrhea. Contraception: OCP (estrogen/progesterone). Pt reports she does not desire pregnancy in the next year. Last Pap: 02/2018. Results were: normal per EPIC. Pt denies history of abnormal Pap.  Obstetric History OB History  Gravida Para Term Preterm AB Living  0 0 0 0 0 0  SAB IAB Ectopic Multiple Live Births  0 0 0 0      Past Medical History:  Diagnosis Date   ADHD (attention deficit hyperactivity disorder)    Mood disorder (HCC)    Seizures (HCC)    childhood    Past Surgical History:  Procedure Laterality Date   TONSILLECTOMY AND ADENOIDECTOMY      Current Outpatient Medications on File Prior to Visit  Medication Sig Dispense Refill   fluticasone (FLONASE) 50 MCG/ACT nasal spray Place 2 sprays into both nostrils daily. 16 g 6   Levonorgestrel-Ethinyl Estradiol (AMETHIA) 0.15-0.03 &0.01 MG tablet Take 1 tablet by mouth daily.     mometasone (NASONEX) 50 MCG/ACT nasal spray Use 1-2 sprays in each nostril once daily for stuffy nose or drainage. 17 g 3   olopatadine (PATANOL) 0.1 % ophthalmic solution Can use one  drop in each eye twice a day as needed. 5 mL 5   Olopatadine HCl (PATANASE) 0.6 % SOLN Place 2 sprays into the nose daily. 30.5 g 11   paliperidone (INVEGA) 6 MG 24 hr tablet Take 6 mg by mouth every morning.     benzonatate (TESSALON) 200 MG capsule Take 1 capsule (200 mg total) by mouth 2 (two) times daily as needed for cough. (Patient not taking: Reported on 06/16/2021) 20 capsule 0   ciclopirox (PENLAC) 8 % solution Apply topically at bedtime. Apply over nail and surrounding skin. Apply daily over previous coat. After seven (7) days, may remove with alcohol and continue cycle. (Patient not taking: Reported on 06/16/2021) 6.6 mL 4   Levonorgestrel-Ethinyl Estradiol (AMETHIA) 0.15-0.03 &0.01 MG tablet Take 1 tablet by mouth daily. (Patient not taking: Reported on 06/16/2021) 91 tablet 0   polyethylene glycol powder (GLYCOLAX/MIRALAX) powder Take 1/2 capful (8 grams) daily dissolved in at least 8 ounces water/juice (Patient not taking: Reported on 06/16/2021) 527 g 1   ranitidine (ZANTAC) 150 MG tablet Take 1 tablet (150 mg total) by mouth 2 (two) times daily. 60 tablet 3   No current facility-administered medications on file prior to visit.    Allergies  Allergen Reactions   Doxycycline Rash    Social History:  reports that she has never smoked. She has never used smokeless tobacco. She reports that she does not drink alcohol and does not use drugs.  Family History  Problem Relation Age of Onset  Diabetes Mother    Asthma Sister    Stroke Maternal Uncle     The following portions of the patient's history were reviewed and updated as appropriate: allergies, current medications, past family history, past medical history, past social history, past surgical history and problem list.  Review of Systems Pertinent items noted in HPI and remainder of comprehensive ROS otherwise negative.  Physical Exam:  BP 116/80   Pulse 93   Ht 5\' 5"  (1.651 m)   Wt 221 lb 6.4 oz (100.4 kg)   LMP  05/27/2021 (Exact Date)   BMI 36.84 kg/m   CONSTITUTIONAL: Well-developed, well-nourished female in no acute distress.  HENT:  Normocephalic, atraumatic, External right and left ear normal. EYES: Conjunctivae and EOM are normal. Pupils are equal, round, and reactive to light. No scleral icterus.  NECK: Normal range of motion, supple, no masses.  Normal thyroid.  SKIN: Skin is warm and dry. No rash noted. Not diaphoretic. No erythema. No pallor. MUSCULOSKELETAL: Normal range of motion. No tenderness.  No cyanosis, clubbing, or edema. NEUROLOGIC: Alert and oriented to person, place, and time. Normal reflexes, muscle tone coordination. PSYCHIATRIC: Normal mood and affect. Normal behavior. Normal judgment and thought content. CARDIOVASCULAR: Normal heart rate noted, regular rhythm. RESPIRATORY: Clear to auscultation bilaterally. Effort and breath sounds normal, no problems with respiration noted. BREASTS: Symmetric in size. No masses, skin changes, nipple drainage, or lymphadenopathy. ABDOMEN: Soft, normal bowel sounds, no distention noted.  No tenderness, rebound or guarding.  PELVIC: Normal appearing external genitalia; normal appearing vaginal mucosa and cervix.  No abnormal discharge noted.  Pap smear obtained. Bimanual exam not indicated.   Assessment and Plan:    1. Well woman exam - Cytology - PAP( [place]) - Levonorgestrel-Ethinyl Estradiol (AMETHIA) 0.15-0.03 &0.01 MG tablet; Take 1 tablet by mouth daily.  Dispense: 91 tablet; Refill: 4  Will follow up results of pap smear and other testing, if performed, and manage accordingly. Routine preventative health maintenance measures emphasized. Self-breast awareness taught, importance discussed, advised when to RTC, SBA literature given. Please refer to After Visit Summary for other counseling recommendations.      02-13-1997, [facility] Women's Health Nurse Practitioner, [facility] for [facility]

## 2021-06-16 NOTE — Progress Notes (Signed)
Pt present with (sister) legal guardian for annual. Needs rf on BCP. Has menstrual cycles q 3 months. Pt never sexually active. Denies abnormal vaginal discharge Normal pap smear 02/07/2018  PHQ9= 1 GAD7= 0

## 2021-06-16 NOTE — Patient Instructions (Signed)
Preventive Care 5-29 Years Old, Female Preventive care refers to lifestyle choices and visits with your health care provider that can promote health and wellness. Preventive care visits are also called wellness exams. What can I expect for my preventive care visit? Counseling During your preventive care visit, your health care provider may ask about your: Medical history, including: Past medical problems. Family medical history. Pregnancy history. Current health, including: Menstrual cycle. Method of birth control. Emotional well-being. Home life and relationship well-being. Sexual activity and sexual health. Lifestyle, including: Alcohol, nicotine or tobacco, and drug use. Access to firearms. Diet, exercise, and sleep habits. Work and work Statistician. Sunscreen use. Safety issues such as seatbelt and bike helmet use. Physical exam Your health care provider may check your: Height and weight. These may be used to calculate your BMI (body mass index). BMI is a measurement that tells if you are at a healthy weight. Waist circumference. This measures the distance around your waistline. This measurement also tells if you are at a healthy weight and may help predict your risk of certain diseases, such as type 2 diabetes and high blood pressure. Heart rate and blood pressure. Body temperature. Skin for abnormal spots. What immunizations do I need? Vaccines are usually given at various ages, according to a schedule. Your health care provider will recommend vaccines for you based on your age, medical history, and lifestyle or other factors, such as travel or where you work. What tests do I need? Screening Your health care provider may recommend screening tests for certain conditions. This may include: Pelvic exam and Pap test. Lipid and cholesterol levels. Diabetes screening. This is done by checking your blood sugar (glucose) after you have not eaten for a while (fasting). Hepatitis B  test. Hepatitis C test. HIV (human immunodeficiency virus) test. STI (sexually transmitted infection) testing, if you are at risk. BRCA-related cancer screening. This may be done if you have a family history of breast, ovarian, tubal, or peritoneal cancers. Talk with your health care provider about your test results, treatment options, and if necessary, the need for more tests. Follow these instructions at home: Eating and drinking  Eat a healthy diet that includes fresh fruits and vegetables, whole grains, lean protein, and low-fat dairy products. Take vitamin and mineral supplements as recommended by your health care provider. Do not drink alcohol if: Your health care provider tells you not to drink. You are pregnant, may be pregnant, or are planning to become pregnant. If you drink alcohol: Limit how much you have to 0-1 drink a day. Know how much alcohol is in your drink. In the [place] one drink equals one 12 oz bottle of beer (355 mL), one 5 oz glass of wine (148 mL), or one 1 oz glass of hard liquor (44 mL). Lifestyle Brush your teeth every morning and night with fluoride toothpaste. Floss one time each day. Exercise for at least 30 minutes 5 or more days each week. Do not use any products that contain nicotine or tobacco. These products include cigarettes, chewing tobacco, and vaping devices, such as e-cigarettes. If you need help quitting, ask your health care provider. Do not use drugs. If you are sexually active, practice safe sex. Use a condom or other form of protection to prevent STIs. If you do not wish to become pregnant, use a form of birth control. If you plan to become pregnant, see your health care provider for a prepregnancy visit. Find healthy ways to manage stress, such as: Meditation, yoga,  or listening to music. Journaling. Talking to a trusted person. Spending time with friends and family. Minimize exposure to UV radiation to reduce your risk of skin  cancer. Safety Always wear your seat belt while driving or riding in a vehicle. Do not drive: If you have been drinking alcohol. Do not ride with someone who has been drinking. If you have been using any mind-altering substances or drugs. While texting. When you are tired or distracted. Wear a helmet and other protective equipment during sports activities. If you have firearms in your house, make sure you follow all gun safety procedures. Seek help if you have been physically or sexually abused. What's next? Go to your health care provider once a year for an annual wellness visit. Ask your health care provider how often you should have your eyes and teeth checked. Stay up to date on all vaccines. This information is not intended to replace advice given to you by your health care provider. Make sure you discuss any questions you have with your health care provider. Document Revised: 01/20/2021 Document Reviewed: 01/20/2021 Elsevier Patient Education  [place] Breast self-awareness means being familiar with how your breasts look and feel. It involves checking your breasts regularly and reporting any changes to your health care provider. Practicing breast self-awareness is important. Sometimes changes may not be harmful (are benign), but sometimes a change in your breasts can be a sign of a serious medical problem. It is important to learn how to do this procedure correctly so that you can catch problems early, when treatment is more likely to be successful. All women should practice breast self-awareness, including women who have had breast implants. What you need: A mirror. A well-lit room. How to do a breast self-exam A breast self-exam is one way to learn what is normal for your breasts and whether your breasts are changing. To do a breast self-exam: Look for changes  Remove all the clothing above your waist. Stand in front of a mirror in a  room with good lighting. Put your hands on your hips. Push your hands firmly downward. Compare your breasts in the mirror. Look for differences between them (asymmetry), such as: Differences in shape. Differences in size. Puckers, dips, and bumps in one breast and not the other. Look at each breast for changes in the skin, such as: Redness. Scaly areas. Look for changes in your nipples, such as: Discharge. Bleeding. Dimpling. Redness. A change in position. Feel for changes Carefully feel your breasts for lumps and changes. It is best to do this while lying on your back on the floor, and again while sitting or standing in the tub or shower with soapy water on your skin. Feel each breast in the following way: Place the arm on the side of the breast you are examining above your head. Feel your breast with the other hand. Start in the nipple area and make -inch (2 cm) overlapping circles to feel your breast. Use the pads of your three middle fingers to do this. Apply light pressure, then medium pressure, then firm pressure. The light pressure will allow you to feel the tissue closest to the skin. The medium pressure will allow you to feel the tissue that is a little deeper. The firm pressure will allow you to feel the tissue close to the ribs. Continue the overlapping circles, moving downward over the breast until you feel your ribs below your breast. Move one  finger-width toward the center of the body. Continue to use the -inch (2 cm) overlapping circles to feel your breast as you move slowly up toward your collarbone. Continue the up-and-down exam using all three pressures until you reach your armpit.  Write down what you find Writing down what you find can help you remember what to discuss with your health care provider. Write down: What is normal for each breast. Any changes that you find in each breast, including: The kind of changes you find. Any pain or tenderness. Size and  location of any lumps. Where you are in your menstrual cycle, if you are still menstruating. General tips and recommendations Examine your breasts every month. If you are breastfeeding, the best time to examine your breasts is after a feeding or after using a breast pump. If you menstruate, the best time to examine your breasts is 5-7 days after your period. Breasts are generally lumpier during menstrual periods, and it may be more difficult to notice changes. With time and practice, you will become more familiar with the variations in your breasts and more comfortable with the exam. Contact a health care provider if you: See a change in the shape or size of your breasts or nipples. See a change in the skin of your breast or nipples, such as a reddened or scaly area. Have unusual discharge from your nipples. Find a lump or thick area that was not there before. Have pain in your breasts. Have any concerns related to your breast health. Summary Breast self-awareness includes looking for physical changes in your breasts, as well as feeling for any changes within your breasts. Breast self-awareness should be performed in front of a mirror in a well-lit room. You should examine your breasts every month. If you menstruate, the best time to examine your breasts is 5-7 days after your menstrual period. Let your health care provider know of any changes you notice in your breasts, including changes in size, changes on the skin, pain or tenderness, or unusual fluid from your nipples. This information is not intended to replace advice given to you by your health care provider. Make sure you discuss any questions you have with your health care provider. Document Revised: 03/13/2018 Document Reviewed: 03/13/2018 Elsevier Patient Education  [place].

## 2021-06-17 LAB — CYTOLOGY - PAP: Diagnosis: NEGATIVE

## 2021-09-08 ENCOUNTER — Ambulatory Visit (INDEPENDENT_AMBULATORY_CARE_PROVIDER_SITE_OTHER): Payer: Medicare Other

## 2021-09-08 ENCOUNTER — Other Ambulatory Visit: Payer: Self-pay

## 2021-09-08 DIAGNOSIS — Z0000 Encounter for general adult medical examination without abnormal findings: Secondary | ICD-10-CM | POA: Diagnosis not present

## 2021-09-08 NOTE — Progress Notes (Addendum)
I connected with Ashley [name] today by telephone and verified that I am speaking with the correct person using two identifiers. Location patient: home Location provider: work Persons participating in the virtual visit: patient, provider.   I discussed the limitations, risks, security and privacy concerns of performing an evaluation and management service by telephone and the availability of in person appointments. I also discussed with the patient that there may be a patient responsible charge related to this service. The patient expressed understanding and verbally consented to this telephonic visit.    Interactive audio and video telecommunications were attempted between this provider and patient, however failed, due to patient having technical difficulties OR patient did not have access to video capability.  We continued and completed visit with audio only.  Some vital signs may be absent or patient reported.   Time Spent with patient on telephone encounter: 40 minutes  Subjective:   SUMAYYA [name] is a 31 y.o. female who presents for Medicare Annual (Subsequent) preventive examination.  Review of Systems     Cardiac Risk Factors include: family history of premature cardiovascular disease     Objective:    There were no vitals filed for this visit. There is no height or weight on file to calculate BMI.  Advanced Directives 09/08/2021 08/05/2020 05/02/2020 03/18/2018 05/13/2015  Does Patient Have a Medical Advance Directive? No No No No No  Would patient like information on creating a medical advance directive? No - Patient declined No - Patient declined No - Patient declined No - Patient declined No - patient declined information    Current Medications (verified) Outpatient Encounter Medications as of 09/08/2021  Medication Sig   benzonatate (TESSALON) 200 MG capsule Take 1 capsule (200 mg total) by mouth 2 (two) times daily as needed for cough. (Patient not taking: Reported on  06/16/2021)   ciclopirox (PENLAC) 8 % solution Apply topically at bedtime. Apply over nail and surrounding skin. Apply daily over previous coat. After seven (7) days, may remove with alcohol and continue cycle. (Patient not taking: Reported on 06/16/2021)   fluticasone (FLONASE) 50 MCG/ACT nasal spray Place 2 sprays into both nostrils daily.   Levonorgestrel-Ethinyl Estradiol (AMETHIA) 0.15-0.03 &0.01 MG tablet Take 1 tablet by mouth daily.   mometasone (NASONEX) 50 MCG/ACT nasal spray Use 1-2 sprays in each nostril once daily for stuffy nose or drainage.   olopatadine (PATANOL) 0.1 % ophthalmic solution Can use one drop in each eye twice a day as needed.   Olopatadine HCl (PATANASE) 0.6 % SOLN Place 2 sprays into the nose daily.   paliperidone (INVEGA) 6 MG 24 hr tablet Take 6 mg by mouth every morning.   polyethylene glycol powder (GLYCOLAX/MIRALAX) powder Take 1/2 capful (8 grams) daily dissolved in at least 8 ounces water/juice (Patient not taking: Reported on 06/16/2021)   ranitidine (ZANTAC) 150 MG tablet Take 1 tablet (150 mg total) by mouth 2 (two) times daily.   No facility-administered encounter medications on file as of 09/08/2021.    Allergies (verified) Doxycycline   History: Past Medical History:  Diagnosis Date   ADHD (attention deficit hyperactivity disorder)    Mood disorder (HCC)    Seizures ([place])    childhood   Past Surgical History:  Procedure Laterality Date   TONSILLECTOMY AND ADENOIDECTOMY     Family History  Problem Relation Age of Onset   Diabetes Mother    Asthma Sister    Stroke Maternal Uncle    Social History   Socioeconomic History  Marital status: Single    Spouse name: Not on file   Number of children: 0   Years of education: Not on file   Highest education level: Not on file  Occupational History   Not on file  Tobacco Use   Smoking status: Never   Smokeless tobacco: Never  Vaping Use   Vaping Use: Never used  Substance and Sexual  Activity   Alcohol use: No   Drug use: No   Sexual activity: Never    Birth control/protection: Pill  Other Topics Concern   Not on file  Social History Narrative   Lives with mom (Ashley [name]) and sister (Ashley [name]) both whoa re patients here.   Mom is her guardian, unable to live alone due to mental disability.   Social Determinants of Health   Financial Resource Strain: Low Risk    Difficulty of Paying Living Expenses: Not hard at all  Food Insecurity: No Food Insecurity   Worried About Charity fundraiser in the Last Year: Never true   [place] in the Last Year: Never true  Transportation Needs: No Transportation Needs   Lack of Transportation (Medical): No   Lack of Transportation (Non-Medical): No  Physical Activity: Sufficiently Active   Days of Exercise per Week: 5 days   Minutes of Exercise per Session: 30 min  Stress: No Stress Concern Present   Feeling of Stress : Not at all  Social Connections: Moderately Integrated   Frequency of Communication with Friends and Family: More than three times a week   Frequency of Social Gatherings with Friends and Family: More than three times a week   Attends Religious Services: More than 4 times per year   Active Member of [employer] or Organizations: No   Attends Music therapist: More than 4 times per year   Marital Status: Never married    Tobacco Counseling Counseling given: Not Answered   Clinical Intake:  Pre-visit preparation completed: Yes  Pain : No/denies pain     Nutritional Risks: None, Other (Comment) (Patients stays hydrated and gets 3 meals per day) Diabetes: No  How often do you need to have someone help you when you read instructions, pamphlets, or other written materials from your doctor or pharmacy?: 1 - Never What is the last grade level you completed in school?: HSG  Diabetic? no  Interpreter Needed?: No  Information entered by :: Lisette Abu,  LPN   Activities of Daily Living In your present state of health, do you have any difficulty performing the following activities: 09/08/2021  Hearing? N  Vision? N  Difficulty concentrating or making decisions? N  Walking or climbing stairs? N  Dressing or bathing? N  Doing errands, shopping? Y  Preparing Food and eating ? N  Using the Toilet? N  In the past six months, have you accidently leaked urine? N  Do you have problems with loss of bowel control? N  Managing your Medications? Y  Managing your Finances? Y  Housekeeping or managing your Housekeeping? Y  Some recent data might be hidden    Patient Care Team: Hoyt Koch, MD as PCP - General (Internal Medicine) Jerrell Belfast, MD as Attending Physician (Otolaryngology) Gean Quint, MD (Inactive) as Attending Physician (Allergy) Lahoma Crocker, MD as Attending Physician (Obstetrics and Gynecology) Monna Fam, MD as Consulting Physician (Ophthalmology)  Indicate any recent Medical Services you may have received from other than [facility] providers in the past year (date may  be approximate).     Assessment:   This is a routine wellness examination for [place].  Hearing/Vision screen Hearing Screening - Comments:: Patient denied any hearing difficulty.   Vision Screening - Comments:: Patient does not wear any corrective lenses.  Annual [facility] exam done at: [facility].  Dietary issues and exercise activities discussed: Current Exercise Habits: Home exercise routine;Structured exercise class (Patient has rejoined the [facility]), Type of exercise: walking;treadmill, Time (Minutes): 30, Frequency (Times/Week): 5, Weekly Exercise (Minutes/Week): 150, Intensity: Moderate, Exercise limited by: None identified   Goals Addressed               This Visit's Progress     Patient Stated (pt-stated)        Stay healthy as possible.      Depression Screen PHQ 2/9 Scores 09/08/2021 06/16/2021 08/05/2020  08/01/2019 07/25/2018 05/13/2015  PHQ - 2 Score 0 0 0 0 0 0  PHQ- 9 Score - 1 - - - -    Fall Risk Fall Risk  09/08/2021 08/05/2020 05/13/2015  Falls in the past year? 0 0 No  Number falls in past yr: 0 0 -  Injury with Fall? 0 0 -  Risk for fall due to : No Fall Risks No Fall Risks -  Follow up Falls evaluation completed Falls evaluation completed -    FALL RISK PREVENTION PERTAINING TO THE HOME:  Any stairs in or around the home? No  If so, are there any without handrails? No  Home free of loose throw rugs in walkways, pet beds, electrical cords, etc? Yes  Adequate lighting in your home to reduce risk of falls? Yes   ASSISTIVE DEVICES UTILIZED TO PREVENT FALLS:  Life alert? No  Use of a cane, walker or w/c? No  Grab bars in the bathroom? Yes  Shower chair or bench in shower? Yes  Elevated toilet seat or a handicapped toilet? Yes   TIMED UP AND GO:  Was the test performed? No .  Length of time to ambulate 10 feet: n/a sec.   Gait steady and fast without use of assistive device  Cognitive Function: Normal cognitive status assessed by direct observation by this Nurse Health Advisor. No abnormalities found.          Immunizations Immunization History  Administered Date(s) Administered   Influenza,inj,Quad PF,6+ Mos 05/09/2019, 08/05/2020, 06/10/2021   PFIZER(Purple Top)SARS-COV-2 Vaccination 12/30/2019, 01/20/2020   Tdap 12/07/2010, 03/18/2018    TDAP status: Up to date  Flu Vaccine status: Up to date  Pneumococcal vaccine: not of age  Covid-72 vaccine status: Completed vaccines  Qualifies for Shingles Vaccine? No   Zostavax completed No   Shingrix Completed?: No.    Education has been provided regarding the importance of this vaccine. Patient has been advised to call insurance company to determine out of pocket expense if they have not yet received this vaccine. Advised may also receive vaccine at local pharmacy or Health Dept. Verbalized acceptance and  understanding.  Screening Tests Health Maintenance  Topic Date Due   Hepatitis C Screening  Never done   COVID-19 Vaccine (3 - Booster for Pfizer series) 03/16/2020   PAP-Cervical Cytology Screening  06/16/2024   PAP SMEAR-Modifier  06/16/2024   TETANUS/TDAP  03/18/2028   INFLUENZA VACCINE  Completed   HIV Screening  Completed   HPV VACCINES  Aged Out    Health Maintenance  Health Maintenance Due  Topic Date Due   Hepatitis C Screening  Never done   COVID-19  Vaccine (3 - Booster for Pfizer series) 03/16/2020    Colorectal screening: not of age  Mammogram status: not of age  Bone density status: not of age  Lung Cancer Screening: (Low Dose CT Chest recommended if Age 69-80 years, 62 pack-year currently smoking OR have quit w/in 15years.) does not qualify.   Lung Cancer Screening Referral: no  Additional Screening:  Hepatitis C Screening: does qualify; Completed no  Vision Screening: Recommended annual ophthalmology exams for early detection of glaucoma and other disorders of the eye. Is the patient up to date with their annual eye exam?  Yes  Who is the provider or what is the name of the office in which the patient attends annual eye exams? [facility] If pt is not established with a provider, would they like to be referred to a provider to establish care? No .   Dental Screening: Recommended annual dental exams for proper oral hygiene  Community Resource Referral / Chronic Care Management: CRR required this visit?  No   CCM required this visit?  No      Plan:     I have personally reviewed and noted the following in the patients chart:   Medical and social history Use of alcohol, tobacco or illicit drugs  Current medications and supplements including opioid prescriptions.  Functional ability and status Nutritional status Physical activity Advanced directives List of other physicians Hospitalizations, surgeries, and ER visits in previous 12  months Vitals Screenings to include cognitive, depression, and falls Referrals and appointments  In addition, I have reviewed and discussed with patient certain preventive protocols, quality metrics, and best practice recommendations. A written personalized care plan for preventive services as well as general preventive health recommendations were provided to patient.     Sheral Flow, LPN   [id number]   Nurse Notes:  Patient is cogitatively intact. There were no vitals filed for this visit. There is no height or weight on file to calculate BMI.   Medical screening examination/treatment/procedure(s) were performed by non-physician practitioner and as supervising physician I was immediately available for consultation/collaboration.  I agree with above. Lew Dawes, MD

## 2021-09-08 NOTE — Patient Instructions (Signed)
Ashley [name] , Thank you for taking time to come for your Medicare Wellness Visit. I appreciate your ongoing commitment to your health goals. Please review the following plan we discussed and let me know if I can assist you in the future.   Screening recommendations/referrals: Colonoscopy: Never done due to age 30: Never done due to age Bone Density: Never done due to age Recommended yearly ophthalmology/optometry visit for glaucoma screening and checkup Recommended yearly dental visit for hygiene and checkup  Vaccinations: Influenza vaccine: 06/10/2021; due every Fall season Pneumococcal vaccine: Never done due to age Tdap vaccine: 03/18/2018; due every 10 years Shingles vaccine: Never done due to age  Covid-23: 12/30/2019, 01/20/2020  Advanced directives: Advance directive discussed with you today. Even though you declined this today please call our office should you change your mind and we can give you the proper paperwork for you to fill out.  Conditions/risks identified: Yes; patient's goal is to stay healthy as possible and return to the gym.  Next appointment: Ashley [name] is scheduled for 09/09/2022 at 1:00 pm telephone Medicare Wellness Visit.  If you need to reschedule please call [phone].  Preventive Care 31 Years, Female Preventive care refers to lifestyle choices and visits with your health care provider that can promote health and wellness. What does preventive care include?  A yearly physical exam. This is also called an annual well check. Dental exams once or twice a year. Routine eye exams. Ask your health care provider how often you should have your eyes checked. Personal lifestyle choices, including: Daily care of your teeth and gums. Regular physical activity. Eating a healthy diet. Avoiding tobacco and drug use. Limiting alcohol use. Practicing safe sex. Taking low-dose aspirin daily starting at age 30. Taking vitamin and mineral supplements as recommended by  your health care provider. What happens during an annual well check? The services and screenings done by your health care provider during your annual well check will depend on your age, overall health, lifestyle risk factors, and family history of disease. Counseling  Your health care provider may ask you questions about your: Alcohol use. Tobacco use. Drug use. Emotional well-being. Home and relationship well-being. Sexual activity. Eating habits. Work and work Statistician. Method of birth control. Menstrual cycle. Pregnancy history. Screening  You may have the following tests or measurements: Height, weight, and BMI. Blood pressure. Lipid and cholesterol levels. These may be checked every 5 years, or more frequently if you are over 68 years old. Skin check. Lung cancer screening. You may have this screening every year starting at age 30 if you have a 30-pack-year history of smoking and currently smoke or have quit within the past 15 years. Fecal occult blood test (FOBT) of the stool. You may have this test every year starting at age 30. Flexible sigmoidoscopy or colonoscopy. You may have a sigmoidoscopy every 5 years or a colonoscopy every 10 years starting at age 30. Hepatitis C blood test. Hepatitis B blood test. Sexually transmitted disease (STD) testing. Diabetes screening. This is done by checking your blood sugar (glucose) after you have not eaten for a while (fasting). You may have this done every 1-3 years. Mammogram. This may be done every 1-2 years. Talk to your health care provider about when you should start having regular mammograms. This may depend on whether you have a family history of breast cancer. BRCA-related cancer screening. This may be done if you have a family history of breast, ovarian, tubal, or peritoneal cancers. Pelvic exam and  Pap test. This may be done every 3 years starting at age 30. Starting at age 30, this may be done every 5 years if you have a Pap  test in combination with an HPV test. Bone density scan. This is done to screen for osteoporosis. You may have this scan if you are at high risk for osteoporosis. Discuss your test results, treatment options, and if necessary, the need for more tests with your health care provider. Vaccines  Your health care provider may recommend certain vaccines, such as: Influenza vaccine. This is recommended every year. Tetanus, diphtheria, and acellular pertussis (Tdap, Td) vaccine. You may need a Td booster every 10 years. Zoster vaccine. You may need this after age 32. Pneumococcal 13-valent conjugate (PCV13) vaccine. You may need this if you have certain conditions and were not previously vaccinated. Pneumococcal polysaccharide (PPSV23) vaccine. You may need one or two doses if you smoke cigarettes or if you have certain conditions. Talk to your health care provider about which screenings and vaccines you need and how often you need them. This information is not intended to replace advice given to you by your health care provider. Make sure you discuss any questions you have with your health care provider. Document Released: 08/21/2015 Document Revised: 04/13/2016 Document Reviewed: 05/26/2015 Elsevier Interactive Patient Education  2017 [employer] Prevention in the Home Falls can cause injuries. They can happen to people of all ages. There are many things you can do to make your home safe and to help prevent falls. What can I do on the outside of my home? Regularly fix the edges of walkways and driveways and fix any cracks. Remove anything that might make you trip as you walk through a door, such as a raised step or threshold. Trim any bushes or trees on the path to your home. Use bright outdoor lighting. Clear any walking paths of anything that might make someone trip, such as rocks or tools. Regularly check to see if handrails are loose or broken. Make sure that both sides of any steps  have handrails. Any raised decks and porches should have guardrails on the edges. Have any leaves, snow, or ice cleared regularly. Use sand or salt on walking paths during winter. Clean up any spills in your garage right away. This includes oil or grease spills. What can I do in the bathroom? Use night lights. Install grab bars by the toilet and in the tub and shower. Do not use towel bars as grab bars. Use non-skid mats or decals in the tub or shower. If you need to sit down in the shower, use a plastic, non-slip stool. Keep the floor dry. Clean up any water that spills on the floor as soon as it happens. Remove soap buildup in the tub or shower regularly. Attach bath mats securely with double-sided non-slip rug tape. Do not have throw rugs and other things on the floor that can make you trip. What can I do in the bedroom? Use night lights. Make sure that you have a light by your bed that is easy to reach. Do not use any sheets or blankets that are too big for your bed. They should not hang down onto the floor. Have a firm chair that has side arms. You can use this for support while you get dressed. Do not have throw rugs and other things on the floor that can make you trip. What can I do in the kitchen? Clean up any  spills right away. Avoid walking on wet floors. Keep items that you use a lot in easy-to-reach places. If you need to reach something above you, use a strong step stool that has a grab bar. Keep electrical cords out of the way. Do not use floor polish or wax that makes floors slippery. If you must use wax, use non-skid floor wax. Do not have throw rugs and other things on the floor that can make you trip. What can I do with my stairs? Do not leave any items on the stairs. Make sure that there are handrails on both sides of the stairs and use them. Fix handrails that are broken or loose. Make sure that handrails are as long as the stairways. Check any carpeting to make  sure that it is firmly attached to the stairs. Fix any carpet that is loose or worn. Avoid having throw rugs at the top or bottom of the stairs. If you do have throw rugs, attach them to the floor with carpet tape. Make sure that you have a light switch at the top of the stairs and the bottom of the stairs. If you do not have them, ask someone to add them for you. What else can I do to help prevent falls? Wear shoes that: Do not have high heels. Have rubber bottoms. Are comfortable and fit you well. Are closed at the toe. Do not wear sandals. If you use a stepladder: Make sure that it is fully opened. Do not climb a closed stepladder. Make sure that both sides of the stepladder are locked into place. Ask someone to hold it for you, if possible. Clearly mark and make sure that you can see: Any grab bars or handrails. First and last steps. Where the edge of each step is. Use tools that help you move around (mobility aids) if they are needed. These include: Canes. Walkers. Scooters. Crutches. Turn on the lights when you go into a dark area. Replace any light bulbs as soon as they burn out. Set up your furniture so you have a clear path. Avoid moving your furniture around. If any of your floors are uneven, fix them. If there are any pets around you, be aware of where they are. Review your medicines with your doctor. Some medicines can make you feel dizzy. This can increase your chance of falling. Ask your doctor what other things that you can do to help prevent falls. This information is not intended to replace advice given to you by your health care provider. Make sure you discuss any questions you have with your health care provider. Document Released: 05/21/2009 Document Revised: 12/31/2015 Document Reviewed: 08/29/2014 Elsevier Interactive Patient Education  2017 [employer].

## 2021-10-22 ENCOUNTER — Telehealth: Payer: Self-pay | Admitting: *Deleted

## 2021-10-22 NOTE — Telephone Encounter (Signed)
Pt's Guardian contacted office stating pt had missed some pills and is now bleeding.  ? ?Attempt to return call.  ?No answer, LVM per DPR making aware to start back on birth control and this should help with the bleeding.  ?

## 2021-10-28 ENCOUNTER — Ambulatory Visit (INDEPENDENT_AMBULATORY_CARE_PROVIDER_SITE_OTHER): Payer: Medicare Other | Admitting: Podiatry

## 2021-10-28 ENCOUNTER — Other Ambulatory Visit: Payer: Self-pay

## 2021-10-28 DIAGNOSIS — B351 Tinea unguium: Secondary | ICD-10-CM

## 2021-10-28 MED ORDER — CICLOPIROX 8 % EX SOLN: Freq: Every day | CUTANEOUS | 1 refills | Status: DC

## 2021-10-28 NOTE — Patient Instructions (Signed)
Ciclopirox nail solution ?What is this medication? ?CICLOPIROX (sye kloe PEER ox) NAIL SOLUTION is an antifungal medicine. It used to treat fungal infections of the nails. ?This medicine may be used for other purposes; ask your health care provider or pharmacist if you have questions. ?COMMON BRAND NAME(S): Ciclodan Nail Solution, CNL8, Penlac ?What should I tell my care team before I take this medication? ?They need to know if you have any of these conditions: ?diabetes mellitus ?history of seizures ?HIV infection ?immune system problems or organ transplant ?large areas of burned or damaged skin ?peripheral vascular disease or poor circulation ?taking corticosteroid medication (including steroid inhalers, cream, or lotion) ?an unusual or allergic reaction to ciclopirox, isopropyl alcohol, other medicines, foods, dyes, or preservatives ?pregnant or trying to get pregnant ?breast-feeding ?How should I use this medication? ?This medicine is for external use only. Follow the directions that come with this medicine exactly. Wash and dry your hands before use. Avoid contact with the eyes, mouth or nose. If you do get this medicine in your eyes, rinse out with plenty of cool tap water. Contact your doctor or health care professional if eye irritation occurs. Use at regular intervals. Do not use your medicine more often than directed. Finish the full course prescribed by your doctor or health care professional even if you think you are better. Do not stop using except on your doctor's advice. ?Talk to your pediatrician regarding the use of this medicine in children. While this medicine may be prescribed for children as young as 12 years for selected conditions, precautions do apply. ?Overdosage: If you think you have taken too much of this medicine contact a poison control center or emergency room at once. ?NOTE: This medicine is only for you. Do not share this medicine with others. ?What if I miss a dose? ?If you miss a  dose, use it as soon as you can. If it is almost time for your next dose, use only that dose. Do not use double or extra doses. ?What may interact with this medication? ?Interactions are not expected. Do not use any other skin products without telling your doctor or health care professional. ?This list may not describe all possible interactions. Give your health care provider a list of all the medicines, herbs, non-prescription drugs, or dietary supplements you use. Also tell them if you smoke, drink alcohol, or use illegal drugs. Some items may interact with your medicine. ?What should I watch for while using this medication? ?Tell your doctor or health care professional if your symptoms get worse. Four to six months of treatment may be needed for the nail(s) to improve. Some people may not achieve a complete cure or clearing of the nails by this time. ?Tell your doctor or health care professional if you develop sores or blisters that do not heal properly. If your nail infection returns after stopping using this product, contact your doctor or health care professional. ?What side effects may I notice from receiving this medication? ?Side effects that you should report to your doctor or health care professional as soon as possible: ?allergic reactions like skin rash, itching or hives, swelling of the face, lips, or tongue ?severe irritation, redness, burning, blistering, peeling, swelling, oozing ?Side effects that usually do not require medical attention (report to your doctor or health care professional if they continue or are bothersome): ?mild reddening of the skin ?nail discoloration ?temporary burning or mild stinging at the site of application ?This list may not describe all possible   side effects. Call your doctor for medical advice about side effects. You may report side effects to FDA at 1-800-FDA-1088. ?Where should I keep my medication? ?Keep out of the reach of children. ?Store at room temperature  between 15 and 30 degrees C (59 and 86 degrees F). Do not freeze. Protect from light by storing the bottle in the carton after every use. This medicine is flammable. Keep away from heat and flame. Throw away any unused medicine after the expiration date. ?NOTE: This sheet is a summary. It may not cover all possible information. If you have questions about this medicine, talk to your doctor, pharmacist, or health care provider. ?? 2022 Elsevier/Gold Standard (2007-12-12 00:00:00) ? ?

## 2021-10-30 NOTE — Progress Notes (Signed)
Subjective: ?31 year old female presents the office today for concerns of pain to her left fifth toe.  She said that she was having discomfort but the nail mostly came off and now is not causing significant pain but only slight discomfort of the nails thickened discolored.  No swelling or redness or any drainage or signs of infection that she reports.  No recent treatment otherwise.  No other concerns. ? ?Objective: ?NAD ?DP/PT pulses palpable bilaterally, CRT less than 3 seconds ?Left fifth digit toenail is still intact on the proximal nail fold but is hypertrophic, dystrophic with brown discoloration.  After debridement of the nail there is no further pain.  There is no edema, erythema or signs of infection.  No open lesions. ?Adductovarus of the fifth digit. ?No pain with calf compression, swelling, warmth, erythema ? ?Assessment: ?Left fifth toenail onychomycosis ? ?Plan: ?-All treatment options discussed with the patient including all alternatives, risks, complications.  ?-Sharply debrided and smoothed the left foot digit toenail that was remaining without any complications or bleeding after this there was no further discomfort.  Prescribed Penlac. ?-Patient encouraged to call the office with any questions, concerns, change in symptoms.  ? ?Vivi Barrack DPM ? ?

## 2021-11-05 ENCOUNTER — Other Ambulatory Visit: Payer: Self-pay | Admitting: Internal Medicine

## 2021-11-11 ENCOUNTER — Encounter: Payer: Self-pay | Admitting: Obstetrics and Gynecology

## 2021-11-11 ENCOUNTER — Ambulatory Visit (INDEPENDENT_AMBULATORY_CARE_PROVIDER_SITE_OTHER): Payer: Medicare Other | Admitting: Obstetrics and Gynecology

## 2021-11-11 VITALS — BP 133/80 | HR 91 | Wt 223.0 lb

## 2021-11-11 DIAGNOSIS — N939 Abnormal uterine and vaginal bleeding, unspecified: Secondary | ICD-10-CM | POA: Diagnosis not present

## 2021-11-11 MED ORDER — MEGESTROL ACETATE 40 MG PO TABS: ORAL_TABLET | ORAL | 3 refills | Status: DC

## 2021-11-11 NOTE — Progress Notes (Signed)
?  GYNECOLOGY PROGRESS NOTE ? ?History:  ?[name] is a 31 y.o. G0P0000 presents to [facility] office today for problem gyn visit. She reports AUB.  She denies h/a, dizziness, shortness of breath, n/v, or fever/chills.   ? ?The following portions of the patient's history were reviewed and updated as appropriate: allergies, current medications, past family history, past medical history, past social history, past surgical history and problem list. Last pap smear on 06/16/2021 was normal. ? ?Review of Systems:  ?Pertinent items are noted in HPI. ?  ?Objective:  ?Physical Exam ?Blood pressure 133/80, pulse 91, weight 223 lb (101.2 kg), last menstrual period 10/21/2021. ?VS reviewed, nursing note reviewed,  ?Constitutional: well developed, well nourished, no distress ?HEENT: normocephalic ?CV: normal rate ?Pulm/chest wall: normal effort ?Breast Exam: deferred ?Abdomen: soft ?Neuro: alert and oriented x 3 ?Skin: warm, dry ?Psych: affect normal ?Pelvic exam: Cervix pink, visually closed, without lesion, scant white creamy discharge, vaginal walls and external genitalia normal ?Bimanual exam: Cervix 0/long/high, firm, anterior, neg CMT, uterus nontender, nonenlarged, adnexa without tenderness, enlargement, or mass ? ?Assessment & Plan:  ?1. Abnormal uterine bleeding (AUB) ?- Information provided on AUB and Megace  ?- megestrol (MEGACE) 40 MG tablet; Take one tablet three times a day for 3 days, then one tablet twice daily for 3 days, then one tablet daily  Dispense: 90 tablet; Refill: 3 ? ?Total face-to-face time with patient and her legal guardian was 10 minutes. ? ?Raelyn Mora, CNM ?1:50 PM ?

## 2021-11-11 NOTE — Patient Instructions (Signed)
Abnormal Uterine Bleeding ?Abnormal uterine bleeding means bleeding more than normal from your womb (uterus). It can include: ?Bleeding after sex. ?Bleeding between monthly (menstrual) periods. ?Bleeding that is heavier than normal. ?Monthly periods that last longer than normal. ?Bleeding after you have stopped having your monthly period (menopause). ?You should see a doctor for any kind of bleeding that is not normal. Treatment depends on the cause of your bleeding and how much you bleed. ?Follow these instructions at home: ?Medicines ?Take over-the-counter and prescription medicines only as told by your doctor. ?Ask your doctor about: ?Taking medicines such as aspirin and ibuprofen. Do not take these medicines unless your doctor tells you to take them. ?Taking over-the-counter medicines, vitamins, herbs, and supplements. ?You may be given iron pills. Take them as told by your doctor. ?Managing constipation ?If you take iron pills, you may need to take these actions to prevent or treat trouble pooping (constipation): ?Drink enough fluid to keep your pee (urine) pale yellow. ?Take over-the-counter or prescription medicines. ?Eat foods that are high in fiber. These include beans, whole grains, and fresh fruits and vegetables. ?Limit foods that are high in fat and sugar. These include fried or sweet foods. ?Activity ?Change your activity to decrease bleeding if you need to change your sanitary pad more than one time every 2 hours: ?Lie in bed with your feet raised (elevated). ?Place a cold pack on your lower belly. ?Rest as much as you are able until the bleeding stops or slows down. ?General instructions ?Do not use tampons, douche, or have sex until your doctor says these things are okay. ?Change your pads often. ?Get regular exams. These include: ?Pelvic exams. ?Screenings for cancer of the cervix. ?It is up to you to get the results of any tests that are done. Ask how to get your results when they are  ready. ?Watch for any changes in your bleeding. For 2 months, write down: ?When your monthly period starts. ?When your monthly period ends. ?When you get any abnormal bleeding from your vagina. ?What problems you notice. ?Keep all follow-up visits. ?Contact a doctor if: ?The bleeding lasts more than one week. ?You feel dizzy at times. ?You feel like you may vomit (nausea). ?You vomit. ?You feel light-headed or weak. ?Your symptoms get worse. ?Get help right away if: ?You faint. ?You have to change pads every hour. ?You have pain in your belly. ?You have a fever or chills. ?You get sweaty or weak. ?You pass large blood clots from your vagina. ?These symptoms may be an emergency. Get help right away. Call your local emergency services (911 in the [place] ?Do not wait to see if the symptoms will go away. ?Do not drive yourself to the hospital. ?Summary ?Abnormal uterine bleeding means bleeding more than normal from your womb (uterus). ?Any kind of bleeding that is not normal should be checked by a doctor. ?Treatment depends on the cause of your bleeding and how much you bleed. ?Get help right away if you faint, you have to change pads every hour, or you pass large blood clots from your vagina. ?This information is not intended to replace advice given to you by your health care provider. Make sure you discuss any questions you have with your health care provider. ?Document Revised: 11/24/2020 Document Reviewed: 11/24/2020 ?Elsevier Patient Education ? 2022 Elsevier Inc. ?Megestrol tablets ?What is this medication? ?MEGESTROL (me JES trol) belongs to a class of drugs known as progestins. Megestrol tablets are used to treat advanced breast or  endometrial cancer. ?This medicine may be used for other purposes; ask your health care provider or pharmacist if you have questions. ?COMMON BRAND NAME(S): Megace ?What should I tell my care team before I take this medication? ?They need to know if you have any of these  conditions: ?adrenal gland problems ?history of blood clots of the legs, lungs, or other parts of the body ?diabetes ?kidney disease ?liver disease ?stroke ?an unusual or allergic reaction to megestrol, other medicines, foods, dyes, or preservatives ?pregnant or trying to get pregnant ?breast-feeding ?How should I use this medication? ?Take this medicine by mouth. Follow the directions on the prescription label. Do not take your medicine more often than directed. Take your doses at regular intervals. Do not stop taking except on the advice of your doctor or health care professional. ?Talk to your pediatrician regarding the use of this medicine in children. Special care may be needed. ?Overdosage: If you think you have taken too much of this medicine contact a poison control center or emergency room at once. ?NOTE: This medicine is only for you. Do not share this medicine with others. ?What if I miss a dose? ?If you miss a dose, take it as soon as you can. If it is almost time for your next dose, take only that dose. Do not take double or extra doses. ?What may interact with this medication? ?Do not take this medicine with any of the following medications: ?dofetilide ?This medicine may also interact with the following medications: ?indinavir ?This list may not describe all possible interactions. Give your health care provider a list of all the medicines, herbs, non-prescription drugs, or dietary supplements you use. Also tell them if you smoke, drink alcohol, or use illegal drugs. Some items may interact with your medicine. ?What should I watch for while using this medication? ?Visit your doctor or health care professional for regular checks on your progress. Continue taking this medicine even if you feel better. It may take 2 months of regular use before you know if this medicine is working for your condition. ?This medicine can cause birth defects. Do not get pregnant while taking this drug. Females with  child-bearing potential will need to have a negative pregnancy test before starting this medicine. Use an effective method of birth control while you are taking this medicine. If you think that you might be pregnant talk to your doctor right away. ?If you have diabetes, this medicine may affect blood sugar levels. Check your blood sugar and talk to your doctor or health care professional if you notice changes. ?What side effects may I notice from receiving this medication? ?Side effects that you should report to your doctor or health care professional as soon as possible: ?Side effects that you should report to your doctor or health care professional as soon as possible: ?allergic reactions like skin rash, itching or hives, swelling of the face, lips, or tongue ?breathing problems ?dizziness ?increased blood pressure ?signs and symptoms of a blood clot such as breathing problems; changes in vision; chest pain; severe, sudden headache; pain, swelling, warmth in the leg; trouble speaking; sudden numbness or weakness of the face, arm or leg ?swelling of the ankles, feet, hands ?unusually weak or tired ?vomiting ?Side effects that usually do not require medical attention (report to your doctor or health care professional if they continue or are bothersome): ?breakthrough menstrual bleeding ?changes in sex drive or performance ?diarrhea ?gas ?hot flashes or flushing ?increased appetite ?upset stomach ?weight gain ?This list  may not describe all possible side effects. Call your doctor for medical advice about side effects. You may report side effects to FDA at 1-800-FDA-1088. ?Where should I keep my medication? ?Keep out of the reach of children. ?Store at controlled room temperature between 15 and 30 degrees C (59 and 86 degrees F). Protect from heat above 40 degrees C (104 degrees F). Throw away any unused medicine after the expiration date. ?NOTE: This sheet is a summary. It may not cover all possible information. If  you have questions about this medicine, talk to your doctor, pharmacist, or health care provider. ?? 2022 Elsevier/Gold Standard (2017-08-09 00:00:00) ? ?

## 2021-11-11 NOTE — Progress Notes (Signed)
Pt reports prolonged bleeding since missing a pill in March. She doubled pill but she is still bleeding.  Denies heavy VB. ?

## 2022-05-12 ENCOUNTER — Ambulatory Visit (INDEPENDENT_AMBULATORY_CARE_PROVIDER_SITE_OTHER): Payer: Medicare Other

## 2022-05-12 DIAGNOSIS — Z23 Encounter for immunization: Secondary | ICD-10-CM | POA: Diagnosis not present

## 2022-06-05 IMAGING — DX DG THORACIC SPINE 2V
3 series · 3 of 3 positions shown · non-contrast
Comparison: None.

CLINICAL DATA: back pain

EXAM:
THORACIC SPINE 2 VIEWS

[t-spine ap]
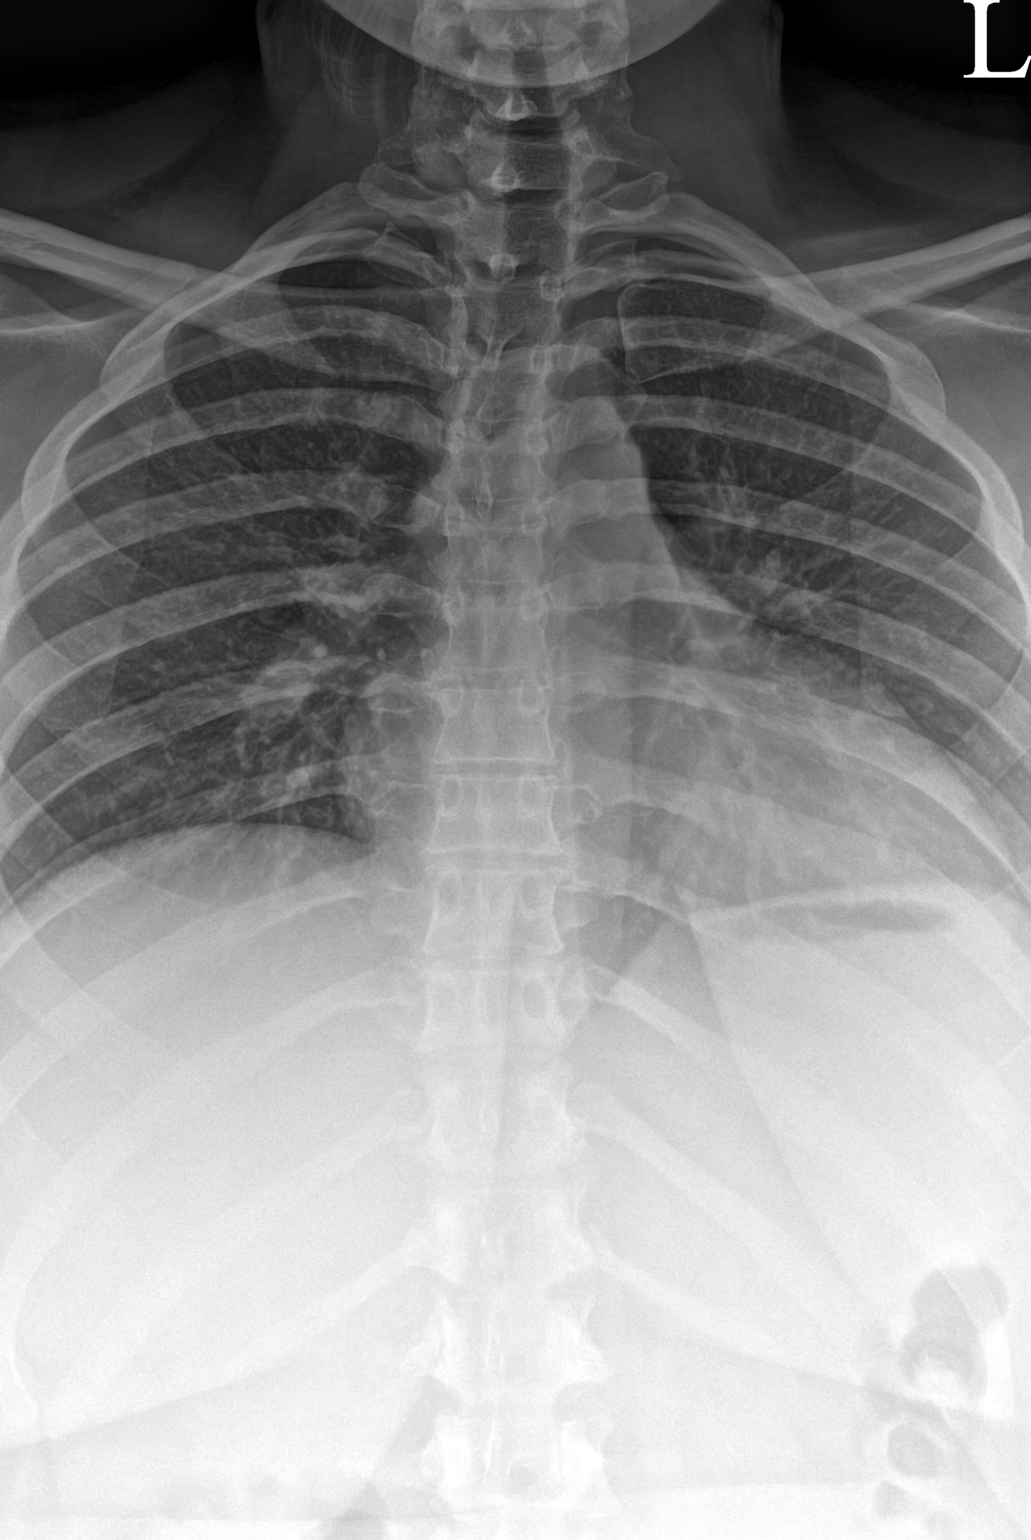

[t-spine lat]
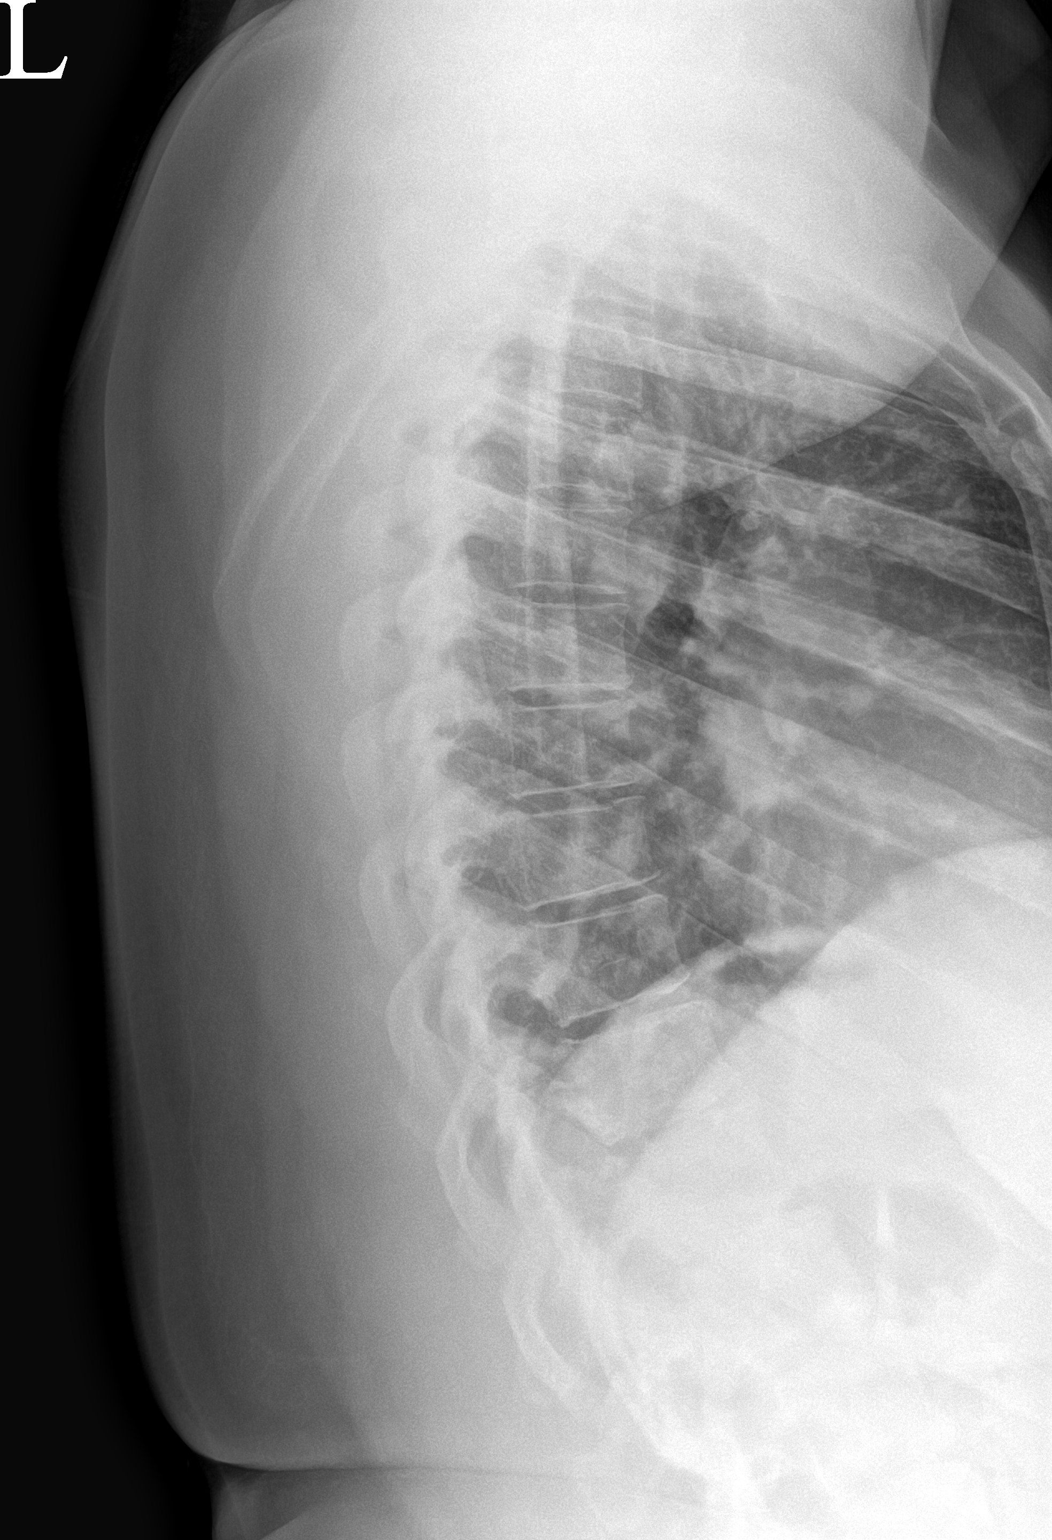

[ct-spine swimmers]
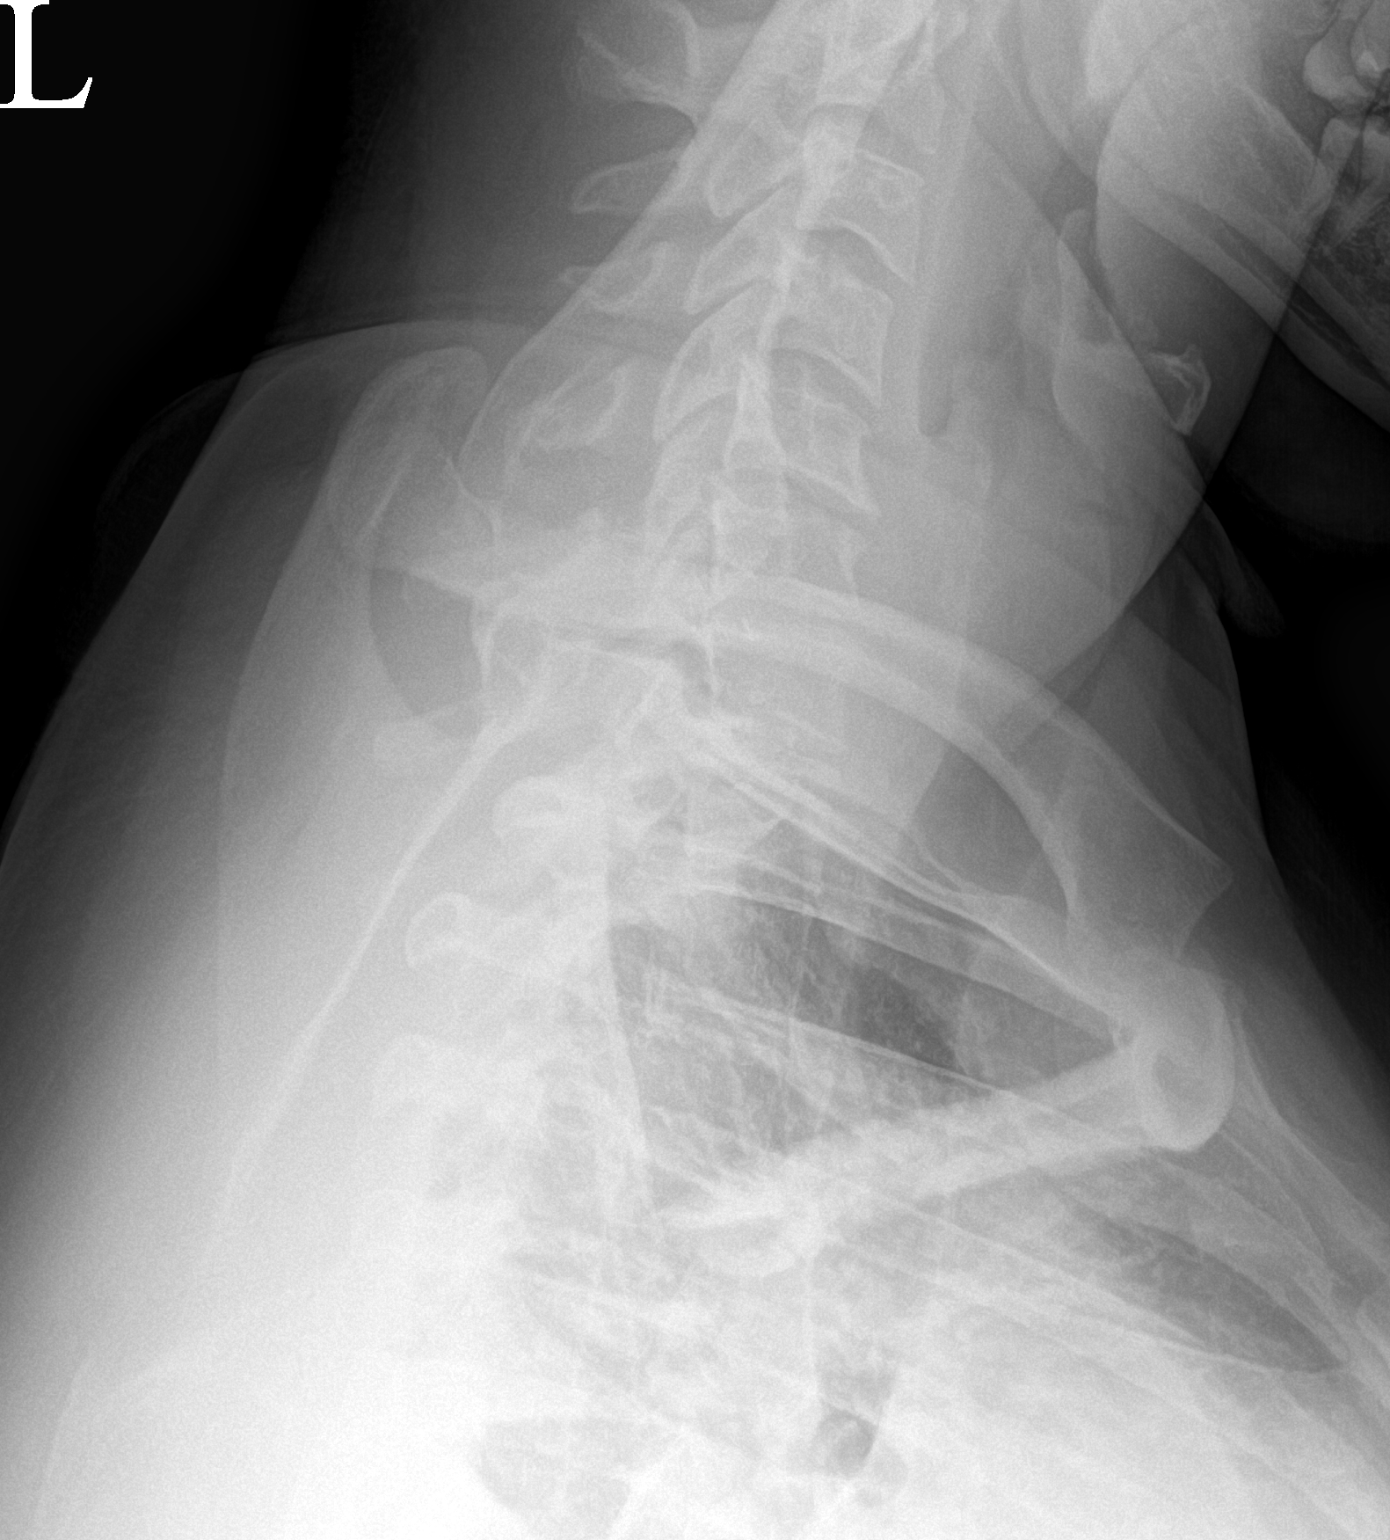

[3 of 3 positions shown; findings below may reference images not displayed]

FINDINGS: There is no evidence of thoracic spine fracture. Alignment is
normal. No other significant bone abnormalities are identified.
IMPRESSION: Negative thoracic spine radiographs.

## 2022-06-17 ENCOUNTER — Other Ambulatory Visit: Payer: Self-pay | Admitting: Internal Medicine

## 2022-08-04 ENCOUNTER — Ambulatory Visit
Admission: EM | Admit: 2022-08-04 | Discharge: 2022-08-04 | Disposition: A | Discharge status: Home / Self Care | Payer: Medicare Other | Attending: Urgent Care | Admitting: Urgent Care

## 2022-08-04 ENCOUNTER — Ambulatory Visit (HOSPITAL_COMMUNITY)
Admission: EM | Admit: 2022-08-04 | Discharge: 2022-08-04 | Discharge status: Against Medical Advice | Payer: Medicare Other

## 2022-08-04 DIAGNOSIS — J309 Allergic rhinitis, unspecified: Secondary | ICD-10-CM

## 2022-08-04 DIAGNOSIS — J069 Acute upper respiratory infection, unspecified: Secondary | ICD-10-CM

## 2022-08-04 MED ORDER — PROMETHAZINE-DM 6.25-15 MG/5ML PO SYRP: 2.5000 mL | ORAL_SOLUTION | Freq: Three times a day (TID) | ORAL | 0 refills | Status: DC | PRN

## 2022-08-04 MED ORDER — IPRATROPIUM BROMIDE 0.03 % NA SOLN: 2.0000 | Freq: Two times a day (BID) | NASAL | 0 refills | Status: DC

## 2022-08-04 MED ORDER — PREDNISONE 20 MG PO TABS: ORAL_TABLET | ORAL | 0 refills | Status: DC

## 2022-08-04 MED ORDER — LEVOCETIRIZINE DIHYDROCHLORIDE 5 MG PO TABS: 5.0000 mg | ORAL_TABLET | Freq: Every evening | ORAL | 0 refills | Status: DC

## 2022-08-04 NOTE — ED Triage Notes (Signed)
Pt c/o cough, nasal/chest congestion x 5 days-NAD-steady gait

## 2022-08-04 NOTE — ED Provider Notes (Signed)
[facility]  Note:  This document was prepared using Conservation officer, historic buildings and may include unintentional dictation errors.  MRN: [id number] DOB: 03/16/1991  Subjective:   Ashley [name] is a 31 y.o. female presenting for 5-day history of acute onset sinus congestion, chest congestion, drainage and coughing. Has had mild body aches. No fever, chest pain. Has allergies, sinus problems. Uses Mucinex, pseudoephedrine. No smoking.  No sick contacts.  No current facility-administered medications for this encounter.  Current Outpatient Medications:    benzonatate (TESSALON) 200 MG capsule, Take 1 capsule (200 mg total) by mouth 2 (two) times daily as needed for cough. (Patient not taking: Reported on 06/16/2021), Disp: 20 capsule, Rfl: 0   ciclopirox (PENLAC) 8 % solution, Apply topically at bedtime. Apply over nail and surrounding skin. Apply daily over previous coat. After seven (7) days, may remove with alcohol and continue cycle. (Patient not taking: Reported on 06/16/2021), Disp: 6.6 mL, Rfl: 4   ciclopirox (PENLAC) 8 % solution, Apply topically at bedtime. Apply over nail and surrounding skin. Apply daily over previous coat. After seven (7) days, may remove with alcohol and continue cycle. (Patient not taking: Reported on 11/11/2021), Disp: 6.6 mL, Rfl: 1   fluticasone (FLONASE) 50 MCG/ACT nasal spray, SHAKE LIQUID AND USE 2 SPRAYS IN EACH NOSTRIL DAILY, Disp: 16 g, Rfl: 6   Levonorgestrel-Ethinyl Estradiol (AMETHIA) 0.15-0.03 &0.01 MG tablet, Take 1 tablet by mouth daily., Disp: 91 tablet, Rfl: 4   megestrol (MEGACE) 40 MG tablet, Take one tablet three times a day for 3 days, then one tablet twice daily for 3 days, then one tablet daily, Disp: 90 tablet, Rfl: 3   mometasone (NASONEX) 50 MCG/ACT nasal spray, Use 1-2 sprays in each nostril once daily for stuffy nose or drainage., Disp: 17 g, Rfl: 3   olopatadine (PATANOL) 0.1 % ophthalmic solution, Can use one drop  in each eye twice a day as needed., Disp: 5 mL, Rfl: 5   Olopatadine HCl (PATANASE) 0.6 % SOLN, Place 2 sprays into the nose daily., Disp: 30.5 g, Rfl: 11   paliperidone (INVEGA) 6 MG 24 hr tablet, Take 6 mg by mouth every morning., Disp: , Rfl:    polyethylene glycol powder (GLYCOLAX/MIRALAX) powder, Take 1/2 capful (8 grams) daily dissolved in at least 8 ounces water/juice (Patient not taking: Reported on 11/11/2021), Disp: 527 g, Rfl: 1   ranitidine (ZANTAC) 150 MG tablet, Take 1 tablet (150 mg total) by mouth 2 (two) times daily. (Patient not taking: Reported on 11/11/2021), Disp: 60 tablet, Rfl: 3   Allergies  Allergen Reactions   Doxycycline Rash    Past Medical History:  Diagnosis Date   ADHD (attention deficit hyperactivity disorder)    Mood disorder (HCC)    Seizures (HCC)    childhood     Past Surgical History:  Procedure Laterality Date   TONSILLECTOMY AND ADENOIDECTOMY      Family History  Problem Relation Age of Onset   Diabetes Mother    Asthma Sister    Stroke Maternal Uncle     Social History   Tobacco Use   Smoking status: Never   Smokeless tobacco: Never  Vaping Use   Vaping Use: Never used  Substance Use Topics   Alcohol use: No   Drug use: No    ROS   Objective:   Vitals: BP 131/88 (BP Location: Right Arm)   Pulse 73   Temp 98.6 F (37 C) (Oral)  Resp 18   SpO2 98%   Physical Exam Constitutional:      General: She is not in acute distress.    Appearance: Normal appearance. She is well-developed and normal weight. She is not ill-appearing, toxic-appearing or diaphoretic.  HENT:     Head: Normocephalic and atraumatic.     Right Ear: Tympanic membrane, ear canal and external ear normal. No drainage or tenderness. No middle ear effusion. There is no impacted cerumen. Tympanic membrane is not erythematous or bulging.     Left Ear: Tympanic membrane, ear canal and external ear normal. No drainage or tenderness.  No middle ear effusion. There  is no impacted cerumen. Tympanic membrane is not erythematous or bulging.     Nose: Nose normal. No congestion or rhinorrhea.     Mouth/Throat:     Mouth: Mucous membranes are moist. No oral lesions.     Pharynx: No pharyngeal swelling, oropharyngeal exudate, posterior oropharyngeal erythema or uvula swelling.     Tonsils: No tonsillar exudate or tonsillar abscesses.  Eyes:     General: No scleral icterus.       Right eye: No discharge.        Left eye: No discharge.     Extraocular Movements: Extraocular movements intact.     Right eye: Normal extraocular motion.     Left eye: Normal extraocular motion.     Conjunctiva/sclera: Conjunctivae normal.  Cardiovascular:     Rate and Rhythm: Normal rate and regular rhythm.     Heart sounds: Normal heart sounds. No murmur heard.    No friction rub. No gallop.  Pulmonary:     Effort: Pulmonary effort is normal. No respiratory distress.     Breath sounds: No stridor. No wheezing, rhonchi or rales.  Chest:     Chest wall: No tenderness.  Musculoskeletal:     Cervical back: Normal range of motion and neck supple.  Lymphadenopathy:     Cervical: No cervical adenopathy.  Skin:    General: Skin is warm and dry.  Neurological:     General: No focal deficit present.     Mental Status: She is alert and oriented to person, place, and time.  Psychiatric:        Mood and Affect: Mood normal.        Behavior: Behavior normal.     Assessment and Plan :   PDMP not reviewed this encounter.  1. Viral upper respiratory infection   2. Allergic rhinitis, unspecified seasonality, unspecified trigger     Deferred COVID testing given timeline of her illness. Deferred imaging given clear cardiopulmonary exam, hemodynamically stable vital signs. Suspect viral URI, viral syndrome. Physical exam findings reassuring and vital signs stable for discharge. Advised supportive care, offered symptomatic relief. Counseled patient on potential for adverse effects  with medications prescribed/recommended today, ER and return-to-clinic precautions discussed, patient verbalized understanding.    In the context of her significant allergic rhinitis, I did offer her an oral prednisone course.  Patient is to try this if she continues to have sinus symptoms and drainage, coughing by Sunday.  Otherwise she can recheck in clinic.  Patient and her mother were very pleased with this.  They will discard prescription if they do not use it.   Wallis Bamberg, PA-C 08/04/22 1114

## 2022-08-18 ENCOUNTER — Other Ambulatory Visit: Payer: Self-pay

## 2022-08-18 ENCOUNTER — Telehealth: Payer: Self-pay

## 2022-08-18 DIAGNOSIS — Z01419 Encounter for gynecological examination (general) (routine) without abnormal findings: Secondary | ICD-10-CM

## 2022-08-18 MED ORDER — LEVONORGEST-ETH ESTRAD 91-DAY 0.15-0.03 &0.01 MG PO TABS: 1.0000 | ORAL_TABLET | Freq: Every day | ORAL | 0 refills | Status: DC

## 2022-08-18 NOTE — Telephone Encounter (Signed)
S/w sister and advised that bc refill was sent to pharmacy to last until annual appt.

## 2022-09-06 ENCOUNTER — Ambulatory Visit (INDEPENDENT_AMBULATORY_CARE_PROVIDER_SITE_OTHER): Payer: 59

## 2022-09-06 ENCOUNTER — Encounter (HOSPITAL_COMMUNITY): Payer: Self-pay

## 2022-09-06 ENCOUNTER — Ambulatory Visit (HOSPITAL_COMMUNITY)
Admission: EM | Admit: 2022-09-06 | Discharge: 2022-09-06 | Disposition: A | Discharge status: Home / Self Care | Payer: 59 | Attending: Physician Assistant | Admitting: Physician Assistant

## 2022-09-06 DIAGNOSIS — S62302A Unspecified fracture of third metacarpal bone, right hand, initial encounter for closed fracture: Secondary | ICD-10-CM

## 2022-09-06 DIAGNOSIS — M7989 Other specified soft tissue disorders: Secondary | ICD-10-CM | POA: Diagnosis not present

## 2022-09-06 NOTE — Discharge Instructions (Signed)
Wear splint Follow up with orthopedics Can take Ibuprofen or Tylenol as needed for pain Apply ice and elevate

## 2022-09-06 NOTE — ED Triage Notes (Signed)
Pt c/o rt middle finger soreness for 3wks and getting worse. Denies known injury. Denies taking any meds.

## 2022-09-06 NOTE — ED Provider Notes (Signed)
[place]    CSN: [id number] Arrival date & time: 09/06/22  1819      History   Chief Complaint Chief Complaint  Patient presents with   Hand Pain    HPI Ashley [name] is a 32 y.o. female.   Patient complains of right middle finger and mid hand pain that started about 3 weeks ago.  She denies trauma.  She reports pain is gradually getting worse.  She has difficulty opening her hand due to pain she is not taking any medications at this time.    Past Medical History:  Diagnosis Date   ADHD (attention deficit hyperactivity disorder)    Mood disorder ([place])    Seizures ([place])    childhood    Patient Active Problem List   Diagnosis Date Noted   Onychomycosis 03/16/2020   Routine general medical examination at a health care facility 07/26/2018   Allergic rhinitis 07/24/2017   Dysmenorrhea 01/25/2013   Obesity (BMI 35.0-39.9 without comorbidity) 12/21/2011   Mood disorder ([place]) 12/21/2011   Constipation 12/21/2011   ADD (attention deficit disorder) 02/19/2010    Past Surgical History:  Procedure Laterality Date   TONSILLECTOMY AND ADENOIDECTOMY      OB History     Gravida  0   Para  0   Term  0   Preterm  0   AB  0   Living  0      SAB  0   IAB  0   Ectopic  0   Multiple  0   Live Births               Home Medications    Prior to Admission medications   Medication Sig Start Date End Date Taking? Authorizing Provider  benzonatate (TESSALON) 200 MG capsule Take 1 capsule (200 mg total) by mouth 2 (two) times daily as needed for cough. Patient not taking: Reported on 06/16/2021 02/02/21   Lucretia Kern, DO  ciclopirox [facility]) 8 % solution Apply topically at bedtime. Apply over nail and surrounding skin. Apply daily over previous coat. After seven (7) days, may remove with alcohol and continue cycle. Patient not taking: Reported on 06/16/2021 03/09/20   Trula Slade, DPM  ciclopirox [facility]) 8 % solution Apply topically at  bedtime. Apply over nail and surrounding skin. Apply daily over previous coat. After seven (7) days, may remove with alcohol and continue cycle. Patient not taking: Reported on 11/11/2021 10/28/21   Trula Slade, DPM  fluticasone [facility]) 50 MCG/ACT nasal spray SHAKE LIQUID AND USE 2 SPRAYS IN [facility] NOSTRIL DAILY 11/05/21   Hoyt Koch, MD  ipratropium (ATROVENT) 0.03 % nasal spray Place 2 sprays into both nostrils 2 (two) times daily. 08/04/22   Jaynee Eagles, PA-C  levocetirizine (XYZAL) 5 MG tablet Take 1 tablet (5 mg total) by mouth every evening. 08/04/22   Jaynee Eagles, PA-C  Levonorgestrel-Ethinyl Estradiol (AMETHIA) 0.15-0.03 &0.01 MG tablet Take 1 tablet by mouth daily. 08/18/22   Shelly Bombard, MD  megestrol (MEGACE) 40 MG tablet Take one tablet three times a day for 3 days, then one tablet twice daily for 3 days, then one tablet daily 11/11/21   Laury Deep, CNM  mometasone (NASONEX) 50 MCG/ACT nasal spray Use 1-2 sprays in each nostril once daily for stuffy nose or drainage. 06/23/20   Lucretia Kern, DO  olopatadine (PATANOL) 0.1 % ophthalmic solution Can use one drop in each eye twice a day as needed.  09/13/16   Kozlow, Donnamarie Poag, MD  Olopatadine HCl (PATANASE) 0.6 % SOLN Place 2 sprays into the nose daily. 01/23/18   Hoyt Koch, MD  paliperidone (INVEGA) 6 MG 24 hr tablet Take 6 mg by mouth every morning. 03/06/20   [provider]  polyethylene glycol powder (GLYCOLAX/MIRALAX) powder Take 1/2 capful (8 grams) daily dissolved in at least 8 ounces water/juice Patient not taking: Reported on 11/11/2021 09/11/17   Pyrtle, Lajuan Lines, MD  ranitidine (ZANTAC) 150 MG tablet Take 1 tablet (150 mg total) by mouth 2 (two) times daily. Patient not taking: Reported on 11/11/2021 09/11/17   Pyrtle, Lajuan Lines, MD    Family History Family History  Problem Relation Age of Onset   Diabetes Mother    Asthma Sister    Stroke Maternal Uncle     Social History Social History   Tobacco  Use   Smoking status: Never   Smokeless tobacco: Never  Vaping Use   Vaping Use: Never used  Substance Use Topics   Alcohol use: No   Drug use: No     Allergies   Doxycycline   Review of Systems Review of Systems  Constitutional:  Negative for chills and fever.  HENT:  Negative for ear pain and sore throat.   Eyes:  Negative for pain and visual disturbance.  Respiratory:  Negative for cough and shortness of breath.   Cardiovascular:  Negative for chest pain and palpitations.  Gastrointestinal:  Negative for abdominal pain and vomiting.  Genitourinary:  Negative for dysuria and hematuria.  Musculoskeletal:  Positive for arthralgias. Negative for back pain.  Skin:  Negative for color change and rash.  Neurological:  Negative for seizures and syncope.  All other systems reviewed and are negative.    Physical Exam Triage Vital Signs ED Triage Vitals [09/06/22 1829]  Enc Vitals Group     BP (!) 138/91     Pulse Rate 86     Resp 18     Temp 98.3 F (36.8 C)     Temp Source Oral     SpO2 98 %     Weight      Height      Head Circumference      Peak Flow      Pain Score      Pain Loc      Pain Edu?      Excl. in [place]?    No data found.  Updated Vital Signs BP (!) 138/91 (BP Location: Left Arm)   Pulse 86   Temp 98.3 F (36.8 C) (Oral)   Resp 18   SpO2 98%   Visual Acuity Right Eye Distance:   Left Eye Distance:   Bilateral Distance:    Right Eye Near:   Left Eye Near:    Bilateral Near:     Physical Exam Vitals and nursing note reviewed.  Constitutional:      General: She is not in acute distress.    Appearance: She is well-developed.  HENT:     Head: Normocephalic and atraumatic.  Eyes:     Conjunctiva/sclera: Conjunctivae normal.  Cardiovascular:     Rate and Rhythm: Normal rate and regular rhythm.     Heart sounds: No murmur heard. Pulmonary:     Effort: Pulmonary effort is normal. No respiratory distress.     Breath sounds: Normal breath  sounds.  Abdominal:     Palpations: Abdomen is soft.     Tenderness: There is no  abdominal tenderness.  Musculoskeletal:        General: No swelling.     Right hand: Tenderness and bony tenderness present.     Cervical back: Neck supple.     Comments: Tenderness to palpation over the metacarpal and middle proximal phalange.  Decreased range of motion due to pain  Skin:    General: Skin is warm and dry.     Capillary Refill: Capillary refill takes less than 2 seconds.  Neurological:     Mental Status: She is alert.  Psychiatric:        Mood and Affect: Mood normal.      UC Treatments / Results  Labs (all labs ordered are listed, but only abnormal results are displayed) Labs Reviewed - No data to display  EKG   Radiology DG Hand Complete Right  Result Date: 09/06/2022 CLINICAL DATA:  Pain to middle finger EXAM: RIGHT HAND - COMPLETE 3+ VIEW COMPARISON:  None Available. FINDINGS: Curvilinear density along the ulnar aspect of the 3rd metacarpal head on the frontal radiograph, indeterminate, but possibly reflecting a tiny fracture in the setting of trauma. The joint spaces are preserved. Dorsal soft tissue swelling overlying the MCP joints on the lateral view. IMPRESSION: Possible tiny fracture involving the 3rd metacarpal head, indeterminate. Correlate for point tenderness. Electronically Signed   By: Julian Hy M.D.   On: 09/06/2022 19:16    Procedures Procedures (including critical care time)  Medications Ordered in UC Medications - No data to display  Initial Impression / Assessment and Plan / UC Course  I have reviewed the triage vital signs and the nursing notes.  Pertinent labs & imaging results that were available during my care of the patient were reviewed by me and considered in my medical decision making (see chart for details).     Fracture involving the third metacarpal head.  Ulnar gutter splint applied in clinic today.  Supportive care discussed.  Advise  follow-up with orthopedic. Final Clinical Impressions(s) / UC Diagnoses   Final diagnoses:  Closed nondisplaced fracture of third metacarpal bone of right hand, unspecified portion of metacarpal, initial encounter     Discharge Instructions      Wear splint Follow up with orthopedics Can take Ibuprofen or Tylenol as needed for pain Apply ice and elevate     ED Prescriptions   None    PDMP not reviewed this encounter.   Ward, Lenise Arena, PA-C 09/06/22 [phone]

## 2022-09-06 NOTE — Progress Notes (Signed)
Orthopedic Tech Progress Note Patient Details:  Ashley [name] 12-17-1990 [id number]  Ortho Devices Type of Ortho Device: Short arm splint Ortho Device/Splint Location: RUE Ortho Device/Splint Interventions: Ordered, Application, Adjustment   Post Interventions Patient Tolerated: Well  Edwina Barth 09/06/2022, 7:50 PM

## 2022-09-09 ENCOUNTER — Ambulatory Visit: Payer: Medicare Other

## 2022-09-12 DIAGNOSIS — M79644 Pain in right finger(s): Secondary | ICD-10-CM | POA: Diagnosis not present

## 2022-09-19 ENCOUNTER — Ambulatory Visit (INDEPENDENT_AMBULATORY_CARE_PROVIDER_SITE_OTHER): Payer: 59 | Admitting: Obstetrics and Gynecology

## 2022-09-19 ENCOUNTER — Encounter: Payer: Self-pay | Admitting: Obstetrics and Gynecology

## 2022-09-19 VITALS — BP 133/85 | HR 74 | Wt 224.6 lb

## 2022-09-19 DIAGNOSIS — Z01419 Encounter for gynecological examination (general) (routine) without abnormal findings: Secondary | ICD-10-CM

## 2022-09-19 DIAGNOSIS — Z3041 Encounter for surveillance of contraceptive pills: Secondary | ICD-10-CM

## 2022-09-19 DIAGNOSIS — Z0000 Encounter for general adult medical examination without abnormal findings: Secondary | ICD-10-CM | POA: Diagnosis not present

## 2022-09-19 MED ORDER — LEVONORGEST-ETH ESTRAD 91-DAY 0.15-0.03 &0.01 MG PO TABS: 1.0000 | ORAL_TABLET | Freq: Every day | ORAL | 0 refills | Status: DC

## 2022-09-19 NOTE — Progress Notes (Signed)
Pt presents for annual exam. Pt needs refill on contraception. Pt reports having vaginal irritation after using scented shower gel. Is requesting diflucan. Denies any abnormal discharge or odor. Denies any abnormal breast changes. No other concerns at this time.

## 2022-09-19 NOTE — Progress Notes (Signed)
ANNUAL EXAM Patient name: Ashley [name] MRN [id number]  Date of birth: 09/27/1990 Chief Complaint:   well woman exam  History of Present Illness:   Ashley [name] is a 32 y.o. G0P0000 with Patient's last menstrual period was 08/22/2022 (approximate). being seen today for a routine annual exam.  Current complaints:  vaginal irritation - mild, started after using new scented soaps Would like refill of COCs because they make her period more manageable   [facility] - 09/19/22 1404       Pregnancy Intention Screening   Does the patient want to become pregnant in the next year? No    Does the patient's partner want to become pregnant in the next year? N/A    Would the patient like to discuss contraceptive options today? No      Contraception Wrap Up   Current Method Oral Contraceptive    End Method Oral Contraceptive    Contraception Counseling Provided No    How was the end contraceptive method provided? Prescription            The pregnancy intention screening data noted above was reviewed. Potential methods of contraception were discussed. The patient elected to proceed with Oral Contraceptive.   Last pap 06/16/21 NILM/HPV negative. H/O abnormal pap: no Last mammogram: n/a Family h/o breast cancer: no Last colonoscopy: n/a Family h/o colorectal cancer: no     09/19/2022    1:53 PM 09/08/2021    1:59 PM 06/16/2021    3:56 PM 08/05/2020    4:17 PM 08/01/2019    9:53 AM  Depression screen PHQ 2/9  Decreased Interest 0 0 0 0 0  Down, Depressed, Hopeless 0 0 0 0 0  PHQ - 2 Score 0 0 0 0 0  Altered sleeping 0  0    Tired, decreased energy 0  1    Change in appetite 0  0    Feeling bad or failure about yourself  0  0    Trouble concentrating 0  0    Moving slowly or fidgety/restless   0    Suicidal thoughts 0  0    PHQ-9 Score 0  1    Difficult doing work/chores   Not difficult at all        09/19/2022    1:55 PM 06/16/2021    3:57 PM  GAD 7 : Generalized Anxiety Score   Nervous, Anxious, on Edge 0 0  Control/stop worrying 0 0  Worry too much - different things 0 0  Trouble relaxing 0 0  Restless 0 0  Easily annoyed or irritable 0 0  Afraid - awful might happen 0 0  Total GAD 7 Score 0 0  Anxiety Difficulty  Not difficult at all   Review of Systems:   Pertinent items are noted in HPI Denies any headaches, blurred vision, fatigue, shortness of breath, chest pain, abdominal pain, abnormal vaginal discharge/itching/odor/irritation, problems with periods, bowel movements, urination, or intercourse unless otherwise stated above. Pertinent History Reviewed:  Reviewed past medical,surgical, social and family history.  Reviewed problem list, medications and allergies. Physical Assessment:   Vitals:   09/19/22 1346  BP: (!) 135/90  Pulse: 76  Weight: 224 lb 9.6 oz (101.9 kg)  Body mass index is 37.38 kg/m.        Physical Examination:   General appearance - well appearing, and in no distress  Mental status - alert, oriented to person, place, and time  Chest - respiratory effort normal  Heart - normal peripheral perfusion  Breasts - deferred  Abdomen - soft, nontender, nondistended, no masses or organomegaly  Pelvic - deferred  No results found for this or any previous visit (from the past 24 hour(s)).  Assessment & Plan:  1) Well-Woman Exam Mammogram: @ 31yo, or sooner if problems Colonoscopy: @ 31yo, or sooner if problems Pap: UTD GC/CT: declines HIV/HCV: HCV ordered, HIV declined  2) Surveillance of oral contraceptive Refill ordered  3) Vaginal irritation Offered BV/yeast swab - declines Discussed conservative management & avoidance of unscented soaps  Labs/procedures today:  Orders Placed This Encounter  Procedures   Hepatitis C Antibody   Meds:  Meds ordered this encounter  Medications   Levonorgestrel-Ethinyl Estradiol (AMETHIA) 0.15-0.03 &0.01 MG tablet    Sig: Take 1 tablet by mouth daily.    Dispense:  91 tablet     Refill:  0   Follow-up: No follow-ups on file.  Inez Catalina, MD 09/19/2022 2:04 PM

## 2022-09-20 LAB — HEPATITIS C ANTIBODY: Hep C Virus Ab: NONREACTIVE

## 2022-09-29 DIAGNOSIS — J302 Other seasonal allergic rhinitis: Secondary | ICD-10-CM | POA: Diagnosis not present

## 2023-01-03 DIAGNOSIS — L732 Hidradenitis suppurativa: Secondary | ICD-10-CM | POA: Diagnosis not present

## 2023-01-03 DIAGNOSIS — Z5181 Encounter for therapeutic drug level monitoring: Secondary | ICD-10-CM | POA: Diagnosis not present

## 2023-02-04 ENCOUNTER — Other Ambulatory Visit: Payer: Self-pay | Admitting: Obstetrics and Gynecology

## 2023-02-04 DIAGNOSIS — Z01419 Encounter for gynecological examination (general) (routine) without abnormal findings: Secondary | ICD-10-CM

## 2023-02-06 ENCOUNTER — Ambulatory Visit (HOSPITAL_COMMUNITY)
Admission: EM | Admit: 2023-02-06 | Discharge: 2023-02-06 | Disposition: A | Discharge status: Home / Self Care | Payer: 59 | Attending: Internal Medicine | Admitting: Internal Medicine

## 2023-02-06 ENCOUNTER — Encounter (HOSPITAL_COMMUNITY): Payer: Self-pay

## 2023-02-06 DIAGNOSIS — R0982 Postnasal drip: Secondary | ICD-10-CM | POA: Diagnosis not present

## 2023-02-06 DIAGNOSIS — J309 Allergic rhinitis, unspecified: Secondary | ICD-10-CM

## 2023-02-06 MED ORDER — PREDNISONE 20 MG PO TABS: 20.0000 mg | ORAL_TABLET | Freq: Every day | ORAL | 0 refills | Status: AC

## 2023-02-06 MED ORDER — BENZONATATE 100 MG PO CAPS: 100.0000 mg | ORAL_CAPSULE | Freq: Three times a day (TID) | ORAL | 0 refills | Status: DC | PRN

## 2023-02-06 MED ORDER — CETIRIZINE HCL 10 MG PO TABS: 10.0000 mg | ORAL_TABLET | Freq: Every day | ORAL | 0 refills | Status: AC

## 2023-02-06 NOTE — Discharge Instructions (Addendum)
Please use medications as prescribed Nasal rinse with saline nasal spray will help with nasal symptoms Please take medications as directed If you have worsening symptoms please return to urgent care to be reevaluated There is no indication for antibiotic use at this time.

## 2023-02-06 NOTE — ED Provider Notes (Signed)
[facility]    CSN: [id number] Arrival date & time: 02/06/23  1059      History   Chief Complaint Chief Complaint  Patient presents with   Cough    HPI Ashley [name] is a 32 y.o. female with a history of seasonal allergies comes to urgent care with a 3-day history of nasal congestion, postnasal drainage and a cough.  Patient denies any fever or chills.  No sore throat.  Patient's brother mother and great-grandmother had similar symptoms over the past couple of weeks.  Patient denies any sputum production.  Nasal discharge is clear.  No fever or chills.  No chest tightness.  Patient has tried Mucinex and Flonase with no improvement in symptoms.  She denies any shortness of breath, chest tightness or wheezing.  No sputum production. HPI  Past Medical History:  Diagnosis Date   ADHD (attention deficit hyperactivity disorder)    Mood disorder ([facility])    Seizures ([facility])    childhood    Patient Active Problem List   Diagnosis Date Noted   Onychomycosis 03/16/2020   Routine general medical examination at a health care facility 07/26/2018   Allergic rhinitis 07/24/2017   Dysmenorrhea 01/25/2013   Obesity (BMI 35.0-39.9 without comorbidity) 12/21/2011   Mood disorder ([facility]) 12/21/2011   Constipation 12/21/2011   ADD (attention deficit disorder) 02/19/2010    Past Surgical History:  Procedure Laterality Date   TONSILLECTOMY AND ADENOIDECTOMY      OB History     Gravida  0   Para  0   Term  0   Preterm  0   AB  0   Living  0      SAB  0   IAB  0   Ectopic  0   Multiple  0   Live Births               Home Medications    Prior to Admission medications   Medication Sig Start Date End Date Taking? Authorizing Provider  benzonatate (TESSALON) 100 MG capsule Take 1 capsule (100 mg total) by mouth 3 (three) times daily as needed for cough. 02/06/23  Yes Jayziah Bankhead, Britta Mccreedy, MD  cetirizine (ZYRTEC ALLERGY) 10 MG tablet Take 1 tablet (10 mg total) by  mouth daily. 02/06/23  Yes Minas Bonser, Britta Mccreedy, MD  fluticasone [facility]) 50 MCG/ACT nasal spray SHAKE LIQUID AND USE 2 SPRAYS IN [facility] NOSTRIL DAILY 11/05/21  Yes Myrlene Broker, MD  Levonorgestrel-Ethinyl Estradiol (AMETHIA) 0.15-0.03 &0.01 MG tablet Take 1 tablet by mouth daily. 09/19/22  Yes Lennart Pall, MD  mometasone (NASONEX) 50 MCG/ACT nasal spray Use 1-2 sprays in each nostril once daily for stuffy nose or drainage. 06/23/20  Yes Kriste Basque R, DO  paliperidone (INVEGA) 6 MG 24 hr tablet Take 6 mg by mouth every morning. 03/06/20  Yes [provider]  predniSONE (DELTASONE) 20 MG tablet Take 1 tablet (20 mg total) by mouth daily for 5 days. 02/06/23 02/11/23 Yes Khaleb Broz, Britta Mccreedy, MD  ciclopirox (PENLAC) 8 % solution Apply topically at bedtime. Apply over nail and surrounding skin. Apply daily over previous coat. After seven (7) days, may remove with alcohol and continue cycle. Patient not taking: Reported on 06/16/2021 03/09/20   Vivi Barrack, DPM  ciclopirox [facility]) 8 % solution Apply topically at bedtime. Apply over nail and surrounding skin. Apply daily over previous coat. After seven (7) days, may remove with alcohol and continue cycle. Patient not taking: Reported  on 11/11/2021 10/28/21   Vivi Barrack, DPM  ipratropium (ATROVENT) 0.03 % nasal spray Place 2 sprays into both nostrils 2 (two) times daily. 08/04/22   Wallis Bamberg, PA-C  polyethylene glycol powder (GLYCOLAX/MIRALAX) powder Take 1/2 capful (8 grams) daily dissolved in at least 8 ounces water/juice Patient not taking: Reported on 11/11/2021 09/11/17   Beverley Fiedler, MD  ranitidine (ZANTAC) 150 MG tablet Take 1 tablet (150 mg total) by mouth 2 (two) times daily. Patient not taking: Reported on 11/11/2021 09/11/17   Pyrtle, Carie Caddy, MD    Family History Family History  Problem Relation Age of Onset   Diabetes Mother    Asthma Sister    Stroke Maternal Uncle     Social History Social History   Tobacco Use    Smoking status: Never   Smokeless tobacco: Never  Vaping Use   Vaping Use: Never used  Substance Use Topics   Alcohol use: No   Drug use: No     Allergies   Doxycycline   Review of Systems Review of Systems As per HPI  Physical Exam Triage Vital Signs ED Triage Vitals [02/06/23 1238]  Enc Vitals Group     BP 124/87     Pulse Rate 80     Resp 16     Temp 98.8 F (37.1 C)     Temp Source Oral     SpO2 98 %     Weight 225 lb (102.1 kg)     Height 5\' 5"  (1.651 m)     Head Circumference      Peak Flow      Pain Score 0     Pain Loc      Pain Edu?      Excl. in [place]?    No data found.  Updated Vital Signs BP 124/87 (BP Location: Left Arm)   Pulse 80   Temp 98.8 F (37.1 C) (Oral)   Resp 16   Ht 5\' 5"  (1.651 m)   Wt 102.1 kg   LMP 12/06/2022 (Approximate)   SpO2 98%   BMI 37.44 kg/m   Visual Acuity Right Eye Distance:   Left Eye Distance:   Bilateral Distance:    Right Eye Near:   Left Eye Near:    Bilateral Near:     Physical Exam Vitals and nursing note reviewed.  Constitutional:      Appearance: Normal appearance. She is not ill-appearing.  HENT:     Right Ear: Tympanic membrane normal.     Left Ear: Tympanic membrane normal.  Cardiovascular:     Rate and Rhythm: Normal rate and regular rhythm.     Pulses: Normal pulses.     Heart sounds: Normal heart sounds.  Pulmonary:     Effort: Pulmonary effort is normal.     Breath sounds: Normal breath sounds.  Abdominal:     General: Abdomen is flat.     Palpations: Abdomen is soft.  Neurological:     Mental Status: She is alert.      UC Treatments / Results  Labs (all labs ordered are listed, but only abnormal results are displayed) Labs Reviewed - No data to display  EKG   Radiology No results found.  Procedures Procedures (including critical care time)  Medications Ordered in UC Medications - No data to display  Initial Impression / Assessment and Plan / UC Course  I have  reviewed the triage vital signs and the nursing notes.  Pertinent labs &  imaging results that were available during my care of the patient were reviewed by me and considered in my medical decision making (see chart for details).     1.  Allergic rhinitis with postnasal drip: Continue Flonase use Zyrtec as needed for coughing Tessalon Perles as needed for cough Maintain adequate hydration Return precautions given. Final Clinical Impressions(s) / UC Diagnoses   Final diagnoses:  Allergic rhinitis with postnasal drip     Discharge Instructions      Please use medications as prescribed Nasal rinse with saline nasal spray will help with nasal symptoms Please take medications as directed If you have worsening symptoms please return to urgent care to be reevaluated There is no indication for antibiotic use at this time.   ED Prescriptions     Medication Sig Dispense Auth. Provider   predniSONE (DELTASONE) 20 MG tablet Take 1 tablet (20 mg total) by mouth daily for 5 days. 5 tablet Soumya Colson, Britta Mccreedy, MD   benzonatate (TESSALON) 100 MG capsule Take 1 capsule (100 mg total) by mouth 3 (three) times daily as needed for cough. 21 capsule Zyere Jiminez, Britta Mccreedy, MD   cetirizine (ZYRTEC ALLERGY) 10 MG tablet Take 1 tablet (10 mg total) by mouth daily. 30 tablet Syrianna Schillaci, Britta Mccreedy, MD      PDMP not reviewed this encounter.   Merrilee Jansky, MD 02/06/23 1331

## 2023-02-06 NOTE — ED Triage Notes (Signed)
Patient here today with c/o cough, PND, and nasal congestion since last Thursday or Friday. Cough is worse at night. She tried taking Mucinex with some relief. Her family has also been sick.

## 2023-02-17 NOTE — Telephone Encounter (Signed)
OCP refill sent by RN.  BP 135/90 w/ recheck 133/85 at annual exam. Pt recently seen in ED and normotensive (124/87) - ok to continue combined OCPs.   Pt will need RN BP check before next refill.   Harvie Bridge, MD Obstetrician & Gynecologist, [facility] for [employer], [facility]

## 2023-04-04 DIAGNOSIS — L732 Hidradenitis suppurativa: Secondary | ICD-10-CM | POA: Diagnosis not present

## 2023-04-04 DIAGNOSIS — Z5181 Encounter for therapeutic drug level monitoring: Secondary | ICD-10-CM | POA: Diagnosis not present

## 2023-04-18 ENCOUNTER — Encounter: Payer: Self-pay | Admitting: Internal Medicine

## 2023-04-18 DIAGNOSIS — J302 Other seasonal allergic rhinitis: Secondary | ICD-10-CM | POA: Diagnosis not present

## 2023-04-18 DIAGNOSIS — Z23 Encounter for immunization: Secondary | ICD-10-CM | POA: Diagnosis not present

## 2023-04-18 DIAGNOSIS — L304 Erythema intertrigo: Secondary | ICD-10-CM | POA: Diagnosis not present

## 2023-04-18 DIAGNOSIS — Z0000 Encounter for general adult medical examination without abnormal findings: Secondary | ICD-10-CM | POA: Diagnosis not present

## 2023-04-18 DIAGNOSIS — Z13220 Encounter for screening for lipoid disorders: Secondary | ICD-10-CM | POA: Diagnosis not present

## 2023-04-18 DIAGNOSIS — Z136 Encounter for screening for cardiovascular disorders: Secondary | ICD-10-CM | POA: Diagnosis not present

## 2023-05-03 DIAGNOSIS — R748 Abnormal levels of other serum enzymes: Secondary | ICD-10-CM | POA: Diagnosis not present

## 2023-05-03 DIAGNOSIS — D72829 Elevated white blood cell count, unspecified: Secondary | ICD-10-CM | POA: Diagnosis not present

## 2023-05-19 DIAGNOSIS — Z23 Encounter for immunization: Secondary | ICD-10-CM | POA: Diagnosis not present

## 2023-08-10 ENCOUNTER — Ambulatory Visit: Payer: 59 | Admitting: Internal Medicine

## 2023-09-20 ENCOUNTER — Encounter: Payer: Self-pay | Admitting: Internal Medicine

## 2023-11-14 DIAGNOSIS — L304 Erythema intertrigo: Secondary | ICD-10-CM | POA: Diagnosis not present

## 2023-11-14 DIAGNOSIS — J988 Other specified respiratory disorders: Secondary | ICD-10-CM | POA: Diagnosis not present

## 2023-11-14 DIAGNOSIS — R058 Other specified cough: Secondary | ICD-10-CM | POA: Diagnosis not present

## 2023-12-05 DIAGNOSIS — L309 Dermatitis, unspecified: Secondary | ICD-10-CM | POA: Diagnosis not present

## 2023-12-05 DIAGNOSIS — L304 Erythema intertrigo: Secondary | ICD-10-CM | POA: Diagnosis not present

## 2023-12-05 DIAGNOSIS — L732 Hidradenitis suppurativa: Secondary | ICD-10-CM | POA: Diagnosis not present

## 2023-12-11 DIAGNOSIS — H5213 Myopia, bilateral: Secondary | ICD-10-CM | POA: Diagnosis not present

## 2023-12-11 DIAGNOSIS — H04123 Dry eye syndrome of bilateral lacrimal glands: Secondary | ICD-10-CM | POA: Diagnosis not present

## 2024-01-03 ENCOUNTER — Ambulatory Visit (INDEPENDENT_AMBULATORY_CARE_PROVIDER_SITE_OTHER): Payer: 59 | Admitting: Internal Medicine

## 2024-01-03 ENCOUNTER — Encounter: Payer: Self-pay | Admitting: Internal Medicine

## 2024-01-03 VITALS — BP 110/78 | HR 86 | Ht 65.0 in | Wt 217.0 lb

## 2024-01-03 DIAGNOSIS — R1013 Epigastric pain: Secondary | ICD-10-CM | POA: Diagnosis not present

## 2024-01-03 NOTE — Progress Notes (Signed)
 Patient ID: Ashley [name], female   DOB: Nov 09, 1990, 33 y.o.   MRN: [id number] HPI: Ashley [name] is a 33 year old female who presents with upper abdominal pain. She is under the care of Dr. Rockland Churn at [facility] for her primary care needs.  She is here today with a family member.  She has been experiencing upper abdominal pain for approximately six months. The pain is described as a 'hurt feeling' located above the umbilicus, not sore or burning. It is sometimes associated with recent BM and occasionally occurs postprandially.  No nausea, vomiting, diarrhea, heartburn, reflux, or hematochezia. She sometimes takes liquid Pepto-Bismol, which provides minimal relief. The pain has not affected her appetite or weight.  She denies dysphagia and odynophagia.  No melena.  No change in bowel habits.  Has a bowel movement every day which she describes as "big and good".  Her past medical history includes hidradenitis suppurativa, ADHD, seizure disorder, and bipolar disorder. She has been prescribed clindamycin, spironolactone, and topical clindamycin for hidradenitis suppurativa but is not currently taking these medications.   Past Medical History:  Diagnosis Date   ADHD (attention deficit hyperactivity disorder)    Mood disorder (HCC)    Seizures (HCC)    childhood    Past Surgical History:  Procedure Laterality Date   TONSILLECTOMY AND ADENOIDECTOMY      Outpatient Medications Prior to Visit  Medication Sig Dispense Refill   fluticasone  (FLONASE ) 50 MCG/ACT nasal spray SHAKE LIQUID AND USE 2 SPRAYS IN EACH NOSTRIL DAILY 16 g 6   paliperidone (INVEGA) 6 MG 24 hr tablet Take 6 mg by mouth every morning.     SIMPESSE 0.15-0.03 &0.01 MG tablet TAKE 1 TABLET BY MOUTH DAILY 91 tablet 0   cetirizine  (ZYRTEC  ALLERGY) 10 MG tablet Take 1 tablet (10 mg total) by mouth daily. (Patient not taking: Reported on 01/03/2024) 30 tablet 0   benzonatate  (TESSALON ) 100 MG capsule Take 1 capsule (100 mg total)  by mouth 3 (three) times daily as needed for cough. (Patient not taking: Reported on 01/03/2024) 21 capsule 0   ciclopirox  (PENLAC ) 8 % solution Apply topically at bedtime. Apply over nail and surrounding skin. Apply daily over previous coat. After seven (7) days, may remove with alcohol and continue cycle. (Patient not taking: Reported on 06/16/2021) 6.6 mL 4   ciclopirox  (PENLAC ) 8 % solution Apply topically at bedtime. Apply over nail and surrounding skin. Apply daily over previous coat. After seven (7) days, may remove with alcohol and continue cycle. (Patient not taking: Reported on 11/11/2021) 6.6 mL 1   ipratropium (ATROVENT ) 0.03 % nasal spray Place 2 sprays into both nostrils 2 (two) times daily. 30 mL 0   mometasone  (NASONEX ) 50 MCG/ACT nasal spray Use 1-2 sprays in each nostril once daily for stuffy nose or drainage. 17 g 3   polyethylene glycol powder (GLYCOLAX /MIRALAX ) powder Take 1/2 capful (8 grams) daily dissolved in at least 8 ounces water/juice (Patient not taking: Reported on 11/11/2021) 527 g 1   ranitidine  (ZANTAC ) 150 MG tablet Take 1 tablet (150 mg total) by mouth 2 (two) times daily. (Patient not taking: Reported on 11/11/2021) 60 tablet 3   No facility-administered medications prior to visit.    Allergies  Allergen Reactions   Doxycycline  Rash    Family History  Problem Relation Age of Onset   Diabetes Mother    Asthma Sister    Stroke Maternal Uncle    Colon cancer Neg Hx  Esophageal cancer Neg Hx    Stomach cancer Neg Hx     Social History   Tobacco Use   Smoking status: Never   Smokeless tobacco: Never  Vaping Use   Vaping status: Never Used  Substance Use Topics   Alcohol use: No   Drug use: No    ROS: As per history of present illness, otherwise negative  BP 110/78   Pulse 86   Ht 5\' 5"  (1.651 m)   Wt 217 lb (98.4 kg)   SpO2 98%   BMI 36.11 kg/m  Gen: awake, alert, NAD HEENT: anicteric  CV: RRR, no mrg Pulm: CTA b/l Abd: soft, mildly tender  in the epigastrium without rebound or guarding, ND, +BS throughout Ext: no c/c/e Neuro: nonfocal   RELEVANT LABS AND IMAGING: CBC    Component Value Date/Time   WBC 8.7 08/01/2019 1015   RBC 4.25 08/01/2019 1015   HGB 13.2 08/01/2019 1015   HCT 39.9 08/01/2019 1015   PLT 256.0 08/01/2019 1015   MCV 93.8 08/01/2019 1015   MCHC 33.2 08/01/2019 1015   RDW 13.5 08/01/2019 1015    CMP     Component Value Date/Time   NA 139 08/01/2019 1015   K 3.8 08/01/2019 1015   CL 107 08/01/2019 1015   CO2 25 08/01/2019 1015   GLUCOSE 85 08/01/2019 1015   BUN 11 08/01/2019 1015   CREATININE 0.82 08/01/2019 1015   CALCIUM 9.2 08/01/2019 1015   PROT 7.1 08/01/2019 1015   ALBUMIN 4.3 08/01/2019 1015   AST 36 08/01/2019 1015   ALT 27 08/01/2019 1015   ALKPHOS 50 08/01/2019 1015   BILITOT 0.9 08/01/2019 1015    ASSESSMENT/PLAN: 33 year old female with history of seizure disorder, ADHD, bipolar disorder and hidradenitis suppurativa here to evaluate abdominal pain.  Abdominal Pain Chronic epigastric pain for six months. Differential includes H. pylori infection and cholelithiasis. No signs of GERD or peptic ulcer disease. - Order stool test for H. pylori with home collection instructions. - Order abdominal ultrasound for gallbladder and liver evaluation. - Consider trial of acid reducer therapy if H. pylori and gallstones are ruled out.  Colon cancer screening Average risk recommended at age 36    ZO:[id number], Marjory Signs, Md [address] [place],  [place] [address]

## 2024-01-03 NOTE — Patient Instructions (Addendum)
 Your provider has ordered "[facility]" stool testing for you. You have received a kit from our office today containing all necessary supplies to complete this test. Please carefully read the stool collection instructions provided in the kit before opening the accompanying materials. In addition, be sure there is a label providing your full name and date of birth on the "puritan opti-swab" tube that is supplied in the kit (if you do not see a label with this information on your test tube, please make us  aware before test collection!). After completing the test, you should secure the purtian tube into the specimen biohazard bag. The [facility] E-Req sheet (including date and time of specimen collection) should be placed into the outside pocket of the specimen biohazard bag and returned to the [facility] (basement floor of [facility]) within 3 days of collection. Please make sure to give the specimen to a staff member at the lab. DO NOT leave the specimen on the counter.   If the specimen date and time (can be found in the upper right boxed portion of the sheet) are not filled out on the E-Req sheet, the test will NOT be performed.  ________________________________  You have been scheduled for an abdominal ultrasound at [facility] (1st floor of hospital) on Wednesday 01/10/24 at 9:00 am. Please arrive 30 minutes prior to your appointment for registration. Make certain not to have anything to eat or drink 6 hours prior to your appointment. Should you need to reschedule your appointment, please contact radiology at [phone]. This test typically takes about 30 minutes to perform.  __________________________________________________ _____  If your blood pressure at your visit was 140/90 or greater, please contact your primary care physician to follow up on this.  _______________________________________________________  If you are age 32 or older, your body mass  index should be between 23-30. Your Body mass index is 36.11 kg/m. If this is out of the aforementioned range listed, please consider follow up with your Primary Care Provider.  If you are age 34 or younger, your body mass index should be between 19-25. Your Body mass index is 36.11 kg/m. If this is out of the aformentioned range listed, please consider follow up with your Primary Care Provider.   ________________________________________________________  The [facility] providers would like to encourage you to use MYCHART to communicate with providers for non-urgent requests or questions.  Due to long hold times on the telephone, sending your provider a message by [facility] may be a faster and more efficient way to get a response.  Please allow 48 business hours for a response.  Please remember that this is for non-urgent requests.  _______________________________________________________ Due to recent changes in healthcare laws, you may see the results of your imaging and laboratory studies on MyChart before your provider has had a chance to review them.  We understand that in some cases there may be results that are confusing or concerning to you. Not all laboratory results come back in the same time frame and the provider may be waiting for multiple results in order to interpret others.  Please give us  48 hours in order for your provider to thoroughly review all the results before contacting the office for clarification of your results.

## 2024-01-04 DIAGNOSIS — R1013 Epigastric pain: Secondary | ICD-10-CM | POA: Diagnosis not present

## 2024-01-08 ENCOUNTER — Telehealth: Payer: Self-pay

## 2024-01-08 NOTE — Telephone Encounter (Signed)
 Patient notified the H. Pylori stool test is negative. Patient verbalized understanding.

## 2024-01-08 NOTE — Telephone Encounter (Signed)
 Received fax from [facility] with patient's H. Pylori stool result. Results state H. Pylori is not detected. Dr. Bridgett Camps, paper result placed on your desk.

## 2024-01-08 NOTE — Telephone Encounter (Signed)
 Ok, thanks Please notify the patient [name]

## 2024-01-10 ENCOUNTER — Ambulatory Visit (HOSPITAL_COMMUNITY)
Admission: RE | Admit: 2024-01-10 | Discharge: 2024-01-10 | Disposition: A | Discharge status: Home / Self Care | Source: Ambulatory Visit | Attending: Internal Medicine | Admitting: Internal Medicine

## 2024-01-10 DIAGNOSIS — R1013 Epigastric pain: Secondary | ICD-10-CM | POA: Diagnosis not present

## 2024-01-16 ENCOUNTER — Other Ambulatory Visit: Payer: Self-pay

## 2024-01-16 ENCOUNTER — Encounter: Payer: Self-pay | Admitting: Internal Medicine

## 2024-01-16 ENCOUNTER — Ambulatory Visit: Payer: Self-pay | Admitting: Internal Medicine

## 2024-01-16 MED ORDER — PANTOPRAZOLE SODIUM 40 MG PO TBEC: 40.0000 mg | DELAYED_RELEASE_TABLET | Freq: Every day | ORAL | 3 refills | Status: AC

## 2024-01-22 ENCOUNTER — Ambulatory Visit (INDEPENDENT_AMBULATORY_CARE_PROVIDER_SITE_OTHER)

## 2024-01-22 ENCOUNTER — Encounter: Payer: Self-pay | Admitting: Podiatry

## 2024-01-22 ENCOUNTER — Ambulatory Visit (INDEPENDENT_AMBULATORY_CARE_PROVIDER_SITE_OTHER): Admitting: Podiatry

## 2024-01-22 DIAGNOSIS — M7752 Other enthesopathy of left foot: Secondary | ICD-10-CM

## 2024-01-22 DIAGNOSIS — Q666 Other congenital valgus deformities of feet: Secondary | ICD-10-CM

## 2024-01-22 DIAGNOSIS — L84 Corns and callosities: Secondary | ICD-10-CM

## 2024-01-22 DIAGNOSIS — M79672 Pain in left foot: Secondary | ICD-10-CM | POA: Diagnosis not present

## 2024-01-24 NOTE — Progress Notes (Signed)
 Subjective:   Patient ID: Ashley [name], female   DOB: 33 y.o.   MRN: [id number]   HPI Chief Complaint  Patient presents with   Foot Pain    RM#12 Left foot pain on left bottom side of foot.   33 year old female presents the office with concerns of pain upon her left foot.  She does not report any open lesions or any injuries.  No swelling or redness or any drainage.  Area tender with pressure.  No other treatment.  No other concerns.   Review of Systems  All other systems reviewed and are negative.  Past Medical History:  Diagnosis Date   ADHD (attention deficit hyperactivity disorder)    Mood disorder (HCC)    Seizures (HCC)    childhood    Past Surgical History:  Procedure Laterality Date   TONSILLECTOMY AND ADENOIDECTOMY       Current Outpatient Medications:    cetirizine  (ZYRTEC  ALLERGY) 10 MG tablet, Take 1 tablet (10 mg total) by mouth daily. (Patient not taking: Reported on 01/03/2024), Disp: 30 tablet, Rfl: 0   fluticasone  (FLONASE ) 50 MCG/ACT nasal spray, SHAKE LIQUID AND USE 2 SPRAYS IN EACH NOSTRIL DAILY, Disp: 16 g, Rfl: 6   paliperidone (INVEGA) 6 MG 24 hr tablet, Take 6 mg by mouth every morning., Disp: , Rfl:    pantoprazole  (PROTONIX ) 40 MG tablet, Take 1 tablet (40 mg total) by mouth daily., Disp: 30 tablet, Rfl: 3   SIMPESSE 0.15-0.03 &0.01 MG tablet, TAKE 1 TABLET BY MOUTH DAILY, Disp: 91 tablet, Rfl: 0  Allergies  Allergen Reactions   Doxycycline  Rash          Objective:  Physical Exam  General: AAO x3, NAD  Dermatological: On the left foot submetatarsal thick hyperkeratotic lesion without any underlying ulceration, drainage or any signs of infection noted today.  There are no open lesions identified.  Vascular: Dorsalis Pedis artery and Posterior Tibial artery pedal pulses are 2/4 bilateral with immedate capillary fill time. There is no pain with calf compression, swelling, warmth, erythema.   Neruologic: Grossly intact via light touch  bilateral.   Musculoskeletal: Decreased medial arch upon weightbearing.  Hyperkeratotic lesion.  No other areas of discomfort.    Assessment:   33 year old female with hyperkeratotic lesion, foot deformity     Plan:  -Treatment options discussed including all alternatives, risks, and complications -Etiology of symptoms were discussed -X-rays obtained reviewed.  3 views of the foot were obtained.  There is no evidence of acute fracture.  Joint spaces maintained. - Sharply debrided the hyperkeratotic lesion without any complications or bleeding. -Discussed moisturizer, offloading. -Discussed shoes, good arch support.  She is in start with over-the-counter inserts to provide better support.  Consider custom with offloading if needed.  Return if symptoms worsen or fail to improve.  Charity Conch DPM

## 2024-01-25 ENCOUNTER — Other Ambulatory Visit: Payer: Self-pay | Admitting: Obstetrics and Gynecology

## 2024-01-25 DIAGNOSIS — Z01419 Encounter for gynecological examination (general) (routine) without abnormal findings: Secondary | ICD-10-CM

## 2024-01-30 ENCOUNTER — Ambulatory Visit: Admitting: Podiatry

## 2024-04-18 DIAGNOSIS — L304 Erythema intertrigo: Secondary | ICD-10-CM | POA: Diagnosis not present

## 2024-04-18 DIAGNOSIS — Z0000 Encounter for general adult medical examination without abnormal findings: Secondary | ICD-10-CM | POA: Diagnosis not present

## 2024-04-18 DIAGNOSIS — J302 Other seasonal allergic rhinitis: Secondary | ICD-10-CM | POA: Diagnosis not present

## 2024-04-18 DIAGNOSIS — E7800 Pure hypercholesterolemia, unspecified: Secondary | ICD-10-CM | POA: Diagnosis not present

## 2024-05-20 ENCOUNTER — Encounter: Payer: Self-pay | Admitting: Podiatry

## 2024-05-20 ENCOUNTER — Ambulatory Visit: Admitting: Podiatry

## 2024-05-20 ENCOUNTER — Ambulatory Visit (INDEPENDENT_AMBULATORY_CARE_PROVIDER_SITE_OTHER): Admitting: Podiatry

## 2024-05-20 DIAGNOSIS — M216X2 Other acquired deformities of left foot: Secondary | ICD-10-CM | POA: Diagnosis not present

## 2024-05-20 DIAGNOSIS — Q666 Other congenital valgus deformities of feet: Secondary | ICD-10-CM | POA: Diagnosis not present

## 2024-05-20 DIAGNOSIS — L84 Corns and callosities: Secondary | ICD-10-CM | POA: Diagnosis not present

## 2024-05-23 NOTE — Progress Notes (Signed)
 Subjective:   Patient ID: Ashley [name], female   DOB: 33 y.o.   MRN: [id number]   HPI Chief Complaint  Patient presents with   Callouses    Rm11 painful callous left /hurts with pressure    33 year old female presents the office with concerns of continued discomfort along the callus to her left foot.  She has been wearing the inserts but seems to aggravate her foot.  She does not recall any injuries or any open lesions.  She has no other concerns today.     Review of Systems  All other systems reviewed and are negative.       Objective:  Physical Exam  General: AAO x3, NAD  Dermatological: On the left foot submetatarsal 5 thick hyperkeratotic lesion without any underlying ulceration, drainage or any signs of infection noted today.  There are no open lesions identified.  Vascular: Dorsalis Pedis artery and Posterior Tibial artery pedal pulses are 2/4 bilateral with immedate capillary fill time. There is no pain with calf compression, swelling, warmth, erythema.   Neruologic: Grossly intact via light touch bilateral.   Musculoskeletal: Decreased medial arch upon weightbearing.  Hyperkeratotic lesion.  No other areas of discomfort.    Assessment:   33 year old female with hyperkeratotic lesion, foot deformity     Plan:  -Treatment options discussed including all alternatives, risks, and complications -Etiology of symptoms were discussed - Sharply debrided the hyperkeratotic lesion without any complications or bleeding as a courtesy.  Do think would benefit from more of a accommodative type insert.  She was seen today by Lolita, pedorthist and off-the-shelf accommodative insert was dispensed to help further offload and cushion the area. -Discussed moisturizer, offloading.  Return if symptoms worsen or fail to improve.  Donnice JONELLE Fees DPM

## 2024-06-04 DIAGNOSIS — L732 Hidradenitis suppurativa: Secondary | ICD-10-CM | POA: Diagnosis not present

## 2024-06-04 DIAGNOSIS — L304 Erythema intertrigo: Secondary | ICD-10-CM | POA: Diagnosis not present

## 2024-06-04 DIAGNOSIS — L309 Dermatitis, unspecified: Secondary | ICD-10-CM | POA: Diagnosis not present

## 2024-06-04 DIAGNOSIS — Z5181 Encounter for therapeutic drug level monitoring: Secondary | ICD-10-CM | POA: Diagnosis not present

## 2024-06-10 NOTE — Progress Notes (Deleted)
   ANNUAL EXAM Patient name: Ashley [name] MRN [id number]  Date of birth: December 01, 1990 Chief Complaint:   No chief complaint on file.  History of Present Illness:   Ashley [name] is a 33 y.o. G0P0000 female being seen today for a routine annual exam.   Current concerns: ***  Current birth control: ***  No LMP recorded. (Menstrual status: Oral contraceptives).  Gardasil: *** Last Pap/Pap History:  2015, 2017, 2019: Normal pap  2022: normal  Review of Systems:   Pertinent items are noted in HPI Denies any headaches, blurred vision, fatigue, shortness of breath, chest pain, abdominal pain, abnormal vaginal discharge/itching/odor/irritation, problems with periods, bowel movements, urination, or intercourse unless otherwise stated above. *** Pertinent History Reviewed:  Reviewed past medical,surgical, social and family history.  Reviewed problem list, medications and allergies. Physical Assessment:  There were no vitals filed for this visit.There is no height or weight on file to calculate BMI.   Physical Examination:  General appearance - well appearing, and in no distress Mental status - alert, oriented to person, place, and time Psych:  She has a normal mood and affect Skin - warm and dry, normal color, no suspicious lesions noted Chest - effort normal Heart - normal rate  Breasts - breasts appear normal, no suspicious masses, no skin or nipple changes or axillary nodes Abdomen - soft, nontender, nondistended, no masses or organomegaly Pelvic -  {Blank single:19197::Performed and:,declines,not indicated} VULVA: {Blank single:19197::Not examined,normal appearing vulva with no masses, tenderness or lesions,***} VAGINA: {Blank single:19197::Not examined,normal appearing vagina with normal color and discharge, no lesions,***} CERVIX: {Blank single:19197::Not examined,normal appearing cervix without discharge or lesions, no CMT.,***} UTERUS: {Blank  single:19197::Not examined,uterus is felt to be normal size, shape, consistency and nontender,***} ADNEXA: {Blank single:19197::Not examined,No adnexal masses or tenderness noted,***} Extremities:  No swelling or varicosities noted  Chaperone present for exam  No results found for this or any previous visit (from the past 24 hours).  Assessment & Plan:  Diagnoses and all orders for this visit:  Encounter for annual routine gynecological examination  - Cervical cancer screening: Discussed guidelines. Pap with HPV done - Gardasil: {Blank single:19197::***,has not yet had. Will provide information,completed,has not yet had. Counseling provided and she declines,Has not yet had. Counseling provided and pt accepts} - GC/CT: {Blank single:19197::accepts,declines,not indicated} - Birth Control: {Birth control type:23956} - Breast Health: Encouraged self breast awareness/SBE. Teaching provided.  - F/U 12 months and prn     No orders of the defined types were placed in this encounter.   Meds: No orders of the defined types were placed in this encounter.   Follow-up: No follow-ups on file.  Vina Solian, MD 06/10/2024 1:15 PM

## 2024-06-11 ENCOUNTER — Ambulatory Visit: Admitting: Obstetrics and Gynecology

## 2024-06-11 DIAGNOSIS — Z01419 Encounter for gynecological examination (general) (routine) without abnormal findings: Secondary | ICD-10-CM

## 2024-07-08 ENCOUNTER — Other Ambulatory Visit (HOSPITAL_COMMUNITY)
Admission: RE | Admit: 2024-07-08 | Discharge: 2024-07-08 | Disposition: A | Source: Ambulatory Visit | Attending: Obstetrics and Gynecology | Admitting: Obstetrics and Gynecology

## 2024-07-08 ENCOUNTER — Ambulatory Visit: Admitting: Obstetrics and Gynecology

## 2024-07-08 ENCOUNTER — Encounter: Payer: Self-pay | Admitting: Obstetrics and Gynecology

## 2024-07-08 VITALS — BP 135/73 | HR 87 | Ht 65.0 in | Wt 233.0 lb

## 2024-07-08 DIAGNOSIS — Z23 Encounter for immunization: Secondary | ICD-10-CM | POA: Diagnosis not present

## 2024-07-08 DIAGNOSIS — Z124 Encounter for screening for malignant neoplasm of cervix: Secondary | ICD-10-CM | POA: Insufficient documentation

## 2024-07-08 DIAGNOSIS — Z1151 Encounter for screening for human papillomavirus (HPV): Secondary | ICD-10-CM | POA: Insufficient documentation

## 2024-07-08 DIAGNOSIS — Z01419 Encounter for gynecological examination (general) (routine) without abnormal findings: Secondary | ICD-10-CM | POA: Diagnosis not present

## 2024-07-08 MED ORDER — LEVONORGEST-ETH ESTRAD 91-DAY 0.15-0.03 &0.01 MG PO TABS: 1.0000 | ORAL_TABLET | Freq: Every day | ORAL | 3 refills | Status: AC

## 2024-07-08 NOTE — Addendum Note (Signed)
 Addended by: Areta Terwilliger J on: 07/08/2024 09:28 AM   Modules accepted: Orders

## 2024-07-08 NOTE — Addendum Note (Signed)
 Addended by: ERIK FELTS on: 07/08/2024 08:47 AM   Modules accepted: Orders

## 2024-07-08 NOTE — Progress Notes (Addendum)
 ANNUAL EXAM Patient name: Ashley [name] MRN [id number]  Date of birth: 1991/03/02 Chief Complaint:   Gynecologic Exam  History of Present Illness:   Ashley [name] is a 33 y.o. G0P0000 with Patient's last menstrual period was 05/17/2024 (exact date). being seen today for a routine annual exam.  Current complaints:   None   Upstream - 07/08/24 0825       Pregnancy Intention Screening   Does the patient want to become pregnant in the next year? No    Does the patient's partner want to become pregnant in the next year? No    Would the patient like to discuss contraceptive options today? No      Contraception Wrap Up   Current Method Oral Contraceptive    End Method Oral Contraceptive    Contraception Counseling Provided No    How was the end contraceptive method provided? Prescription         The pregnancy intention screening data noted above was reviewed. Potential methods of contraception were discussed. The patient elected to proceed with Oral Contraceptive.   Last pap 06/16/21 NILM/HPV negative. H/O abnormal pap: no Last mammogram: n/a Family h/o breast cancer: no Last colonoscopy: n/a Family h/o colorectal cancer: no HPV vaccine: unsure     07/08/2024    8:25 AM 09/19/2022    1:53 PM 09/08/2021    1:59 PM 06/16/2021    3:56 PM 08/05/2020    4:17 PM  Depression screen PHQ 2/9  Decreased Interest 0 0 0 0 0  Down, Depressed, Hopeless 0 0 0 0 0  PHQ - 2 Score 0 0 0 0 0  Altered sleeping 0 0  0   Tired, decreased energy 0 0  1   Change in appetite 0 0  0   Feeling bad or failure about yourself  0 0  0   Trouble concentrating 0 0  0   Moving slowly or fidgety/restless 0   0   Suicidal thoughts 0 0  0   PHQ-9 Score 0 0   1    Difficult doing work/chores Not difficult at all   Not difficult at all      Data saved with a previous flowsheet row definition        07/08/2024    8:25 AM 09/19/2022    1:55 PM 06/16/2021    3:57 PM  GAD 7 : Generalized Anxiety Score   Nervous, Anxious, on Edge 0 0 0  Control/stop worrying 0 0 0  Worry too much - different things 0 0 0  Trouble relaxing 0 0 0  Restless 0 0 0  Easily annoyed or irritable 0 0 0  Afraid - awful might happen 0 0 0  Total GAD 7 Score 0 0 0  Anxiety Difficulty Not difficult at all  Not difficult at all     Review of Systems:   Pertinent items are noted in HPI Denies any headaches, blurred vision, fatigue, shortness of breath, chest pain, abdominal pain, abnormal vaginal discharge/itching/odor/irritation, problems with periods, bowel movements, urination, or intercourse unless otherwise stated above. Pertinent History Reviewed:  Reviewed past medical,surgical, social and family history.  Reviewed problem list, medications and allergies. Physical Assessment:   Vitals:   07/08/24 0814  BP: 135/73  Pulse: 87  Weight: 233 lb (105.7 kg)  Height: 5' 5 (1.651 m)  Body mass index is 38.77 kg/m.        Physical Examination:   General appearance - well  appearing, and in no distress  Mental status - alert, oriented to person, place, and time  Chest - respiratory effort normal  Heart - normal peripheral perfusion  Breasts - breasts appear normal, no suspicious masses, no skin or nipple changes or axillary nodes  Abdomen - soft, nontender, nondistended, no masses or organomegaly  Pelvic - VULVA: normal appearing vulva with no masses, tenderness or lesions  VAGINA: normal appearing vagina with normal color and discharge, no lesions  CERVIX: normal appearing cervix without discharge or lesions, no CMT  Thin prep pap is done with HR HPV cotesting  Chaperone present for exam  No results found for this or any previous visit (from the past 24 hours).  Assessment & Plan:  1) Well-Woman Exam Mammogram: @ 33yo, or sooner if problems Colonoscopy: @ 33yo, or sooner if problems Pap: Collected today - if normal will be q5y Gardasil: Discussed, vaccine #1 given today GC/CT: Declines HIV/HCV:  Declines OCP refill sent Flu shot given  Labs/procedures today:   No orders of the defined types were placed in this encounter.  Meds:  Meds ordered this encounter  Medications   Levonorgestrel-Ethinyl Estradiol (SIMPESSE) 0.15-0.03 &0.01 MG tablet    Sig: Take 1 tablet by mouth daily.    Dispense:  91 tablet    Refill:  3   Follow-up: Return in about 8 weeks (around 09/02/2024) for RN visit for HPV vaccine #2.  Kieth JAYSON Carolin, MD 07/08/2024 8:44 AM

## 2024-07-08 NOTE — Progress Notes (Addendum)
 33 y.o. GYN presents for AEX. Pt wants OCP BC refills.  Gardasil vaccine given in LUOG, tolerated well 2nd Gardasil in 2 months, 3rd in 6 months.  FLU vaccine given in LD, tolerated well.

## 2024-07-09 LAB — CYTOLOGY - PAP
Comment: NEGATIVE
Diagnosis: NEGATIVE
High risk HPV: NEGATIVE

## 2024-07-10 ENCOUNTER — Ambulatory Visit: Payer: Self-pay | Admitting: Obstetrics and Gynecology

## 2024-07-30 ENCOUNTER — Encounter: Payer: Self-pay | Admitting: Internal Medicine

## 2024-07-30 ENCOUNTER — Ambulatory Visit: Payer: Self-pay | Admitting: Internal Medicine

## 2024-07-30 ENCOUNTER — Other Ambulatory Visit (INDEPENDENT_AMBULATORY_CARE_PROVIDER_SITE_OTHER)

## 2024-07-30 ENCOUNTER — Ambulatory Visit: Admitting: Internal Medicine

## 2024-07-30 VITALS — BP 118/78 | HR 86 | Ht 65.0 in | Wt 226.1 lb

## 2024-07-30 DIAGNOSIS — R1013 Epigastric pain: Secondary | ICD-10-CM

## 2024-07-30 DIAGNOSIS — R7401 Elevation of levels of liver transaminase levels: Secondary | ICD-10-CM

## 2024-07-30 LAB — HEPATIC FUNCTION PANEL
ALT: 31 U/L (ref 3–35)
AST: 38 U/L — ABNORMAL HIGH (ref 5–37)
Albumin: 4.2 g/dL (ref 3.5–5.2)
Alkaline Phosphatase: 45 U/L (ref 39–117)
Bilirubin, Direct: 0.2 mg/dL (ref 0.1–0.3)
Total Bilirubin: 0.8 mg/dL (ref 0.2–1.2)
Total Protein: 7.4 g/dL (ref 6.0–8.3)

## 2024-07-30 NOTE — Patient Instructions (Signed)
 Continue pantoprazole .   Your provider has requested that you go to the basement level for lab work before leaving today. Press B on the elevator. The lab is located at the first door on the left as you exit the elevator.  _______________________________________________________  If your blood pressure at your visit was 140/90 or greater, please contact your primary care physician to follow up on this.  _______________________________________________________  If you are age 62 or older, your body mass index should be between 23-30. Your Body mass index is 37.63 kg/m. If this is out of the aforementioned range listed, please consider follow up with your Primary Care Provider.  If you are age 88 or younger, your body mass index should be between 19-25. Your Body mass index is 37.63 kg/m. If this is out of the aformentioned range listed, please consider follow up with your Primary Care Provider.   ________________________________________________________  The [facility] providers would like to encourage you to use MYCHART to communicate with providers for non-urgent requests or questions.  Due to long hold times on the telephone, sending your provider a message by [facility] may be a faster and more efficient way to get a response.  Please allow 48 business hours for a response.  Please remember that this is for non-urgent requests.  _______________________________________________________  [facility] is using a team-based approach to care.  Your team is made up of your doctor and two to three APPS. Our APPS (Nurse Practitioners and Physician Assistants) work with your physician to ensure care continuity for you. They are fully qualified to address your health concerns and develop a treatment plan. They communicate directly with your gastroenterologist to care for you. Seeing the Advanced Practice Practitioners on your physician's team can help you by facilitating care more promptly,  often allowing for earlier appointments, access to diagnostic testing, procedures, and other specialty referrals.

## 2024-07-30 NOTE — Progress Notes (Signed)
 "  Subjective:    Patient ID: Ashley [name], female    DOB: [birth year], 33 y.o.   MRN: [id number]  HPI BHAVIKA SCHNIDER is a 33 year old female who presents for follow up of dyspeptic symptoms.  Initially evaluated on Jan 03, 2024 for epigastric abdominal pain described as a distinct 'stomach hurt feeling' in the upper abdomen. Discomfort is not crampy or burning and does not feel like 'fire,' but is otherwise difficult to further characterize.  Prior workup included a negative stool test for H. pylori and a normal abdominal ultrasound, with no evidence of cholelithiasis and a normal visualized bile duct.  Started on pantoprazole  40 mg daily as a trial. Currently uses pantoprazole  as needed, primarily when her stomach feels 'funny,' which occurs less than a couple days per week. Occasionally takes it for a couple days in a row, with resolution of symptoms. Does not require daily dosing. Denies heartburn and indigestion. Bowel movements are normal.  No blood in stool or melena.  No weight loss.  Good appetite.  Aware of a prior slight elevation in one liver enzyme on previous laboratory testing and expresses concern, but has not experienced symptoms attributable to liver dysfunction.  No alcohol intake or known family history of liver disease.  Review of Systems As per HPI, otherwise negative.  Current Medications, Allergies, Past Medical History, Past Surgical History, Family History and Social History were reviewed in [employer] record.    Objective:   Physical Exam BP 118/78   Pulse 86   Ht 5' 5 (1.651 m)   Wt 226 lb 2 oz (102.6 kg)   LMP 05/17/2024 (Exact Date)   BMI 37.63 kg/m  Gen: awake, alert, NAD HEENT: anicteric  Abd: soft, NT/ND, +BS throughout Ext: no c/c/e Neuro: nonfocal  General chemistry reviewed from [facility]where dated 04/04/2023 AST 46, ALT 38, total bili 0.8, alk phos 44, albumin 4.3  ABDOMEN ULTRASOUND COMPLETE   COMPARISON:  None  Available.   FINDINGS: Gallbladder: No definitive cholelithiasis. Prominent gallbladder fold. No wall thickening visualized. No sonographic Murphy sign noted by sonographer.   Common bile duct: Diameter: Visualized portion measures 6 mm, within normal limits.   Liver: No focal lesion identified. Limits of normal in parenchymal echogenicity. Portal vein is patent on color Doppler imaging with normal direction of blood flow towards the liver.   IVC: No abnormality visualized.   Pancreas: Visualized portion unremarkable.   Spleen: Size and appearance within normal limits.   Right Kidney: Length: 11.0 cm. Echogenicity within normal limits. No mass or hydronephrosis visualized.   Left Kidney: Length: 11.4 cm. Echogenicity within normal limits. No mass or hydronephrosis visualized. Portions are suboptimally assessed secondary to shadowing bowel gas.   Abdominal aorta: No aneurysm visualized.   Other findings: None.   IMPRESSION: No sonographic etiology for abdominal pain is identified.     Electronically Signed   By: Corean Salter M.D.   On: 01/11/2024 08:01      Assessment & Plan:   Dyspepsia Intermittent mild symptoms managed with as-needed pantoprazole . Low risk for significant GI pathology due to young ageo negative H. pylori and normal ultrasound. - Continue pantoprazole  40 mg as needed, avoid daily use if infrequent symptoms. - Reassured low risk with current management.  Elevated liver enzymes Mild fluctuating AST elevation with normal ultrasound and no hepatotoxic medications. Very low concern for hepatic disease. - Ordered hepatic function panel to re-evaluate liver enzymes. - Reviewed medication list, no hepatotoxic  agents identified. - Reassured low risk of liver disease based on normal ultrasound and mild enzyme elevation.  30 minutes total spent today including patient facing time, coordination of care, reviewing medical history/procedures/pertinent  radiology studies, and documentation of the encounter.  "

## 2024-09-02 ENCOUNTER — Ambulatory Visit

## 2024-09-16 ENCOUNTER — Ambulatory Visit

## 2024-10-01 ENCOUNTER — Ambulatory Visit
